# Patient Record
Sex: Male | Born: 1940 | Race: White | Hispanic: No | Marital: Married | Smoking: Never smoker
Health system: Southern US, Community
[De-identification: ages and names within clinical notes are randomized; demographics above are authoritative.]

## PROBLEM LIST (undated history)

## (undated) DIAGNOSIS — C801 Malignant (primary) neoplasm, unspecified: Secondary | ICD-10-CM

## (undated) DIAGNOSIS — E039 Hypothyroidism, unspecified: Secondary | ICD-10-CM

## (undated) HISTORY — PX: OTHER SURGICAL HISTORY: SHX169

---

## 2016-01-30 ENCOUNTER — Encounter (HOSPITAL_COMMUNITY): Payer: Self-pay | Admitting: *Deleted

## 2016-01-30 ENCOUNTER — Emergency Department (HOSPITAL_COMMUNITY)
Admission: EM | Admit: 2016-01-30 | Discharge: 2016-01-30 | Disposition: A | Discharge status: Home / Self Care | Payer: Medicare Other | Attending: Emergency Medicine | Admitting: Emergency Medicine

## 2016-01-30 DIAGNOSIS — N390 Urinary tract infection, site not specified: Secondary | ICD-10-CM

## 2016-01-30 DIAGNOSIS — T839XXA Unspecified complication of genitourinary prosthetic device, implant and graft, initial encounter: Secondary | ICD-10-CM

## 2016-01-30 DIAGNOSIS — Y733 Surgical instruments, materials and gastroenterology and urology devices (including sutures) associated with adverse incidents: Secondary | ICD-10-CM | POA: Insufficient documentation

## 2016-01-30 DIAGNOSIS — T83098A Other mechanical complication of other indwelling urethral catheter, initial encounter: Secondary | ICD-10-CM | POA: Insufficient documentation

## 2016-01-30 DIAGNOSIS — Z85118 Personal history of other malignant neoplasm of bronchus and lung: Secondary | ICD-10-CM | POA: Diagnosis not present

## 2016-01-30 HISTORY — DX: Malignant (primary) neoplasm, unspecified: C80.1

## 2016-01-30 LAB — URINE MICROSCOPIC-ADD ON

## 2016-01-30 LAB — URINALYSIS, ROUTINE W REFLEX MICROSCOPIC
BILIRUBIN URINE: NEGATIVE
GLUCOSE, UA: NEGATIVE mg/dL
Ketones, ur: NEGATIVE mg/dL
Nitrite: NEGATIVE
PH: 7.5 (ref 5.0–8.0)
Protein, ur: 30 mg/dL — AB
SPECIFIC GRAVITY, URINE: 1.009 (ref 1.005–1.030)

## 2016-01-30 MED ORDER — CEPHALEXIN 500 MG PO CAPS: 500.0000 mg | ORAL_CAPSULE | Freq: Four times a day (QID) | ORAL | Status: DC

## 2016-01-30 MED ORDER — CEPHALEXIN 500 MG PO CAPS
500.0000 mg | ORAL_CAPSULE | Freq: Once | ORAL | Status: AC
  Administered 2016-01-30: 500 mg via ORAL
  Filled 2016-01-30: qty 1

## 2016-01-30 NOTE — Discharge Instructions (Signed)
It was our pleasure to provide your ER care today - we hope that you feel better.  Empty foley bag/leg bag as need.  Your lab tests show a urine infection - take antibiotic as prescribed - we also sent a urine culture, the results of which will be back in 2-3 days.   Follow up with your doctor upon returning home.  Return to ER if worse, symptoms recur, catheter not working, not making urine, abdominal pain, fevers, other concern.     Foley Catheter Care, Adult A Foley catheter is a soft, flexible tube that is placed into the bladder to drain urine. A Foley catheter may be inserted if:  You leak urine or are not able to control when you urinate (urinary incontinence).  You are not able to urinate when you need to (urinary retention).  You had prostate surgery or surgery on the genitals.  You have certain medical conditions, such as multiple sclerosis, dementia, or a spinal cord injury. If you are going home with a Foley catheter in place, follow the instructions below. TAKING CARE OF THE CATHETER  Wash your hands with soap and water.  Using mild soap and warm water on a clean washcloth:  Clean the area on your body closest to the catheter insertion site using a circular motion, moving away from the catheter. Never wipe toward the catheter because this could sweep bacteria up into the urethra and cause infection.  Remove all traces of soap. Pat the area dry with a clean towel. For males, reposition the foreskin.  Attach the catheter to your leg so there is no tension on the catheter. Use adhesive tape or a leg strap. If you are using adhesive tape, remove any sticky residue left behind by the previous tape you used.  Keep the drainage bag below the level of the bladder, but keep it off the floor.  Check throughout the day to be sure the catheter is working and urine is draining freely. Make sure the tubing does not become kinked.  Do not pull on the catheter or try to remove  it. Pulling could damage internal tissues. TAKING CARE OF THE DRAINAGE BAGS You will be given two drainage bags to take home. One is a large overnight drainage bag, and the other is a smaller leg bag that fits underneath clothing. You may wear the overnight bag at any time, but you should never wear the smaller leg bag at night. Follow the instructions below for how to empty, change, and clean your drainage bags. Emptying the Drainage Bag You must empty your drainage bag when it is  - full or at least 2-3 times a day.  Wash your hands with soap and water.  Keep the drainage bag below your hips, below the level of your bladder. This stops urine from going back into the tubing and into your bladder.  Hold the dirty bag over the toilet or a clean container.  Open the pour spout at the bottom of the bag and empty the urine into the toilet or container. Do not let the pour spout touch the toilet, container, or any other surface. Doing so can place bacteria on the bag, which can cause an infection.  Clean the pour spout with a gauze pad or cotton ball that has rubbing alcohol on it.  Close the pour spout.  Attach the bag to your leg with adhesive tape or a leg strap.  Wash your hands well. Changing the Drainage Bag Change your  drainage bag once a month or sooner if it starts to smell bad or look dirty. Below are steps to follow when changing the drainage bag.  Wash your hands with soap and water.  Pinch off the rubber catheter so that urine does not spill out.  Disconnect the catheter tube from the drainage tube at the connection valve. Do not let the tubes touch any surface.  Clean the end of the catheter tube with an alcohol wipe. Use a different alcohol wipe to clean the end of the drainage tube.  Connect the catheter tube to the drainage tube of the clean drainage bag.  Attach the new bag to the leg with adhesive tape or a leg strap. Avoid attaching the new bag too tightly.  Wash  your hands well. Cleaning the Drainage Bag 1. Wash your hands with soap and water. 2. Wash the bag in warm, soapy water. 3. Rinse the bag thoroughly with warm water. 4. Fill the bag with a solution of white vinegar and water (1 cup vinegar to 1 qt warm water [.2 L vinegar to 1 L warm water]). Close the bag and soak it for 30 minutes in the solution. 5. Rinse the bag with warm water. 6. Hang the bag to dry with the pour spout open and hanging downward. 7. Store the clean bag (once it is dry) in a clean plastic bag. 8. Wash your hands well. PREVENTING INFECTION  Wash your hands before and after handling your catheter.  Take showers daily and wash the area where the catheter enters your body. Do not take baths. Replace wet leg straps with dry ones, if this applies.  Do not use powders, sprays, or lotions on the genital area. Only use creams, lotions, or ointments as directed by your caregiver.  For females, wipe from front to back after each bowel movement.  Drink enough fluids to keep your urine clear or pale yellow unless you have a fluid restriction.  Do not let the drainage bag or tubing touch or lie on the floor.  Wear cotton underwear to absorb moisture and to keep your skin drier. SEEK MEDICAL CARE IF:   Your urine is cloudy or smells unusually bad.  Your catheter becomes clogged.  You are not draining urine into the bag or your bladder feels full.  Your catheter starts to leak. SEEK IMMEDIATE MEDICAL CARE IF:   You have pain, swelling, redness, or pus where the catheter enters the body.  You have pain in the abdomen, legs, lower back, or bladder.  You have a fever.  You see blood fill the catheter, or your urine is pink or red.  You have nausea, vomiting, or chills.  Your catheter gets pulled out. MAKE SURE YOU:   Understand these instructions.  Will watch your condition.  Will get help right away if you are not doing well or get worse.   This information  is not intended to replace advice given to you by your health care provider. Make sure you discuss any questions you have with your health care provider.   Document Released: 06/30/2005 Document Revised: 11/14/2013 Document Reviewed: 06/21/2012 Elsevier Interactive Patient Education 2016 Elsevier Inc.    Urinary Tract Infection Urinary tract infections (UTIs) can develop anywhere along your urinary tract. Your urinary tract is your body's drainage system for removing wastes and extra water. Your urinary tract includes two kidneys, two ureters, a bladder, and a urethra. Your kidneys are a pair of bean-shaped organs. Each kidney is  about the size of your fist. They are located below your ribs, one on each side of your spine. CAUSES Infections are caused by microbes, which are microscopic organisms, including fungi, viruses, and bacteria. These organisms are so small that they can only be seen through a microscope. Bacteria are the microbes that most commonly cause UTIs. SYMPTOMS  Symptoms of UTIs may vary by age and gender of the patient and by the location of the infection. Symptoms in young women typically include a frequent and intense urge to urinate and a painful, burning feeling in the bladder or urethra during urination. Older women and men are more likely to be tired, shaky, and weak and have muscle aches and abdominal pain. A fever may mean the infection is in your kidneys. Other symptoms of a kidney infection include pain in your back or sides below the ribs, nausea, and vomiting. DIAGNOSIS To diagnose a UTI, your caregiver will ask you about your symptoms. Your caregiver will also ask you to provide a urine sample. The urine sample will be tested for bacteria and white blood cells. White blood cells are made by your body to help fight infection. TREATMENT  Typically, UTIs can be treated with medication. Because most UTIs are caused by a bacterial infection, they usually can be treated with  the use of antibiotics. The choice of antibiotic and length of treatment depend on your symptoms and the type of bacteria causing your infection. HOME CARE INSTRUCTIONS  If you were prescribed antibiotics, take them exactly as your caregiver instructs you. Finish the medication even if you feel better after you have only taken some of the medication.  Drink enough water and fluids to keep your urine clear or pale yellow.  Avoid caffeine, tea, and carbonated beverages. They tend to irritate your bladder.  Empty your bladder often. Avoid holding urine for long periods of time.  Empty your bladder before and after sexual intercourse.  After a bowel movement, women should cleanse from front to back. Use each tissue only once. SEEK MEDICAL CARE IF:   You have back pain.  You develop a fever.  Your symptoms do not begin to resolve within 3 days. SEEK IMMEDIATE MEDICAL CARE IF:   You have severe back pain or lower abdominal pain.  You develop chills.  You have nausea or vomiting.  You have continued burning or discomfort with urination. MAKE SURE YOU:   Understand these instructions.  Will watch your condition.  Will get help right away if you are not doing well or get worse.   This information is not intended to replace advice given to you by your health care provider. Make sure you discuss any questions you have with your health care provider.   Document Released: 04/09/2005 Document Revised: 03/21/2015 Document Reviewed: 08/08/2011 Elsevier Interactive Patient Education [employer].

## 2016-01-30 NOTE — ED Provider Notes (Addendum)
CSN: [id number]     Arrival date & time 01/30/16  1417 History   First MD Initiated Contact with Patient 01/30/16 1504     Chief Complaint  Patient presents with  . leaking around foley catheter      (Consider location/radiation/quality/duration/timing/severity/associated sxs/prior Treatment) The history is provided by the patient and the spouse.  Patient with hx lung ca, prostate hypertrophy, chronic foley, states today starting leaking urine around catheter.  Denies abdominal pain. No nausea/vomiting. No fever or chills. No dysuria. States has been making normal amount urine.  States other than catheter not working feels at his baseline, no new symptoms.      Past Medical History  Diagnosis Date  . Cancer ([place])     Lung Cancer with METS   Past Surgical History  Procedure Laterality Date  . Cardiac stents     No family history on file. Social History  Substance Use Topics  . Smoking status: Never Smoker   . Smokeless tobacco: None  . Alcohol Use: No    Review of Systems  Constitutional: Negative for fever.  Respiratory: Negative for shortness of breath.   Gastrointestinal: Negative for abdominal pain.  Genitourinary: Negative for dysuria.      Allergies  Review of patient's allergies indicates no known allergies.  Home Medications   Prior to Admission medications   Not on File   BP 111/66 mmHg  Pulse 79  Temp(Src) 97.6 F (36.4 C) (Oral)  Resp 18  Ht '5\' 10"'$  (1.778 m)  Wt 83.915 kg  BMI 26.54 kg/m2  SpO2 98% Physical Exam  Constitutional: He appears well-developed and well-nourished. No distress.  Eyes: Conjunctivae are normal. No scleral icterus.  Neck: Neck supple. No tracheal deviation present.  Cardiovascular: Normal rate.   Pulmonary/Chest: Effort normal. No accessory muscle usage. No respiratory distress.  Abdominal: Soft. He exhibits no distension. There is no tenderness.  Genitourinary:  Normal ext genitalia. Foley cath in place.    Musculoskeletal: Normal range of motion.  Neurological: He is alert.  Skin: Skin is warm and dry. He is not diaphoretic.  Psychiatric: He has a normal mood and affect.  Nursing note and vitals reviewed.   ED Course  Procedures (including critical care time)    Results for orders placed or performed during the hospital encounter of 01/30/16  Urinalysis, Routine w reflex microscopic (not at [facility])  Result Value Ref Range   Color, Urine YELLOW YELLOW   APPearance CLOUDY (A) CLEAR   Specific Gravity, Urine 1.009 1.005 - 1.030   pH 7.5 5.0 - 8.0   Glucose, UA NEGATIVE NEGATIVE mg/dL   Hgb urine dipstick LARGE (A) NEGATIVE   Bilirubin Urine NEGATIVE NEGATIVE   Ketones, ur NEGATIVE NEGATIVE mg/dL   Protein, ur 30 (A) NEGATIVE mg/dL   Nitrite NEGATIVE NEGATIVE   Leukocytes, UA LARGE (A) NEGATIVE  Urine microscopic-add on  Result Value Ref Range   Squamous Epithelial / LPF 0-5 (A) NONE SEEN   WBC, UA TOO NUMEROUS TO COUNT 0 - 5 WBC/hpf   RBC / HPF 6-30 0 - 5 RBC/hpf   Bacteria, UA FEW (A) NONE SEEN   Urine-Other AMORPHOUS URATES/PHOSPHATES      MDM   Leaking around foley, and current cath in place for 4+ weeks.  Will remove current foley and place new.  New catheter placed, urine draining into bag.  500 cc yellow urine in bag, cloudy.   ua with tntc wbc, will culture and rx.   Recheck pt,  states feels well. No fever. No nv.   Pt currently appears stable for d/c.     Lajean Saver, MD 01/30/16 1800

## 2016-01-30 NOTE — Progress Notes (Addendum)
Pt originally from [place] to [place] had pcp in [place] until pcp closed practice and now returns to see pcp in [place] is [facility] [place] [address], [place] [address] [phone]  Oncologist Dr Otelia Limes ? in [place] CV is Dr [name] at [place] [address], [place] [address] [phone]

## 2016-01-30 NOTE — ED Notes (Signed)
Pt's wife reports pt has been having leakage around his foley catheter x 2 days.  Pt denies any pain or blood in urine at this time.  Pt has had the foley x 4 weeks.

## 2016-01-31 LAB — URINE CULTURE

## 2016-02-04 ENCOUNTER — Emergency Department (HOSPITAL_COMMUNITY): Payer: Medicare Other

## 2016-02-04 ENCOUNTER — Observation Stay (HOSPITAL_COMMUNITY): Payer: Medicare Other

## 2016-02-04 ENCOUNTER — Inpatient Hospital Stay (HOSPITAL_COMMUNITY)
Admission: EM | Admit: 2016-02-04 | Discharge: 2016-02-07 | DRG: 871 | Disposition: A | Discharge status: Home / Self Care | Payer: Medicare Other | Attending: Internal Medicine | Admitting: Internal Medicine

## 2016-02-04 ENCOUNTER — Encounter (HOSPITAL_COMMUNITY): Payer: Self-pay | Admitting: Emergency Medicine

## 2016-02-04 DIAGNOSIS — Z9221 Personal history of antineoplastic chemotherapy: Secondary | ICD-10-CM

## 2016-02-04 DIAGNOSIS — C3492 Malignant neoplasm of unspecified part of left bronchus or lung: Secondary | ICD-10-CM | POA: Diagnosis present

## 2016-02-04 DIAGNOSIS — I951 Orthostatic hypotension: Secondary | ICD-10-CM | POA: Diagnosis present

## 2016-02-04 DIAGNOSIS — Z66 Do not resuscitate: Secondary | ICD-10-CM | POA: Diagnosis present

## 2016-02-04 DIAGNOSIS — E43 Unspecified severe protein-calorie malnutrition: Secondary | ICD-10-CM | POA: Diagnosis present

## 2016-02-04 DIAGNOSIS — R079 Chest pain, unspecified: Secondary | ICD-10-CM | POA: Diagnosis present

## 2016-02-04 DIAGNOSIS — R6510 Systemic inflammatory response syndrome (SIRS) of non-infectious origin without acute organ dysfunction: Secondary | ICD-10-CM | POA: Diagnosis not present

## 2016-02-04 DIAGNOSIS — Z79899 Other long term (current) drug therapy: Secondary | ICD-10-CM

## 2016-02-04 DIAGNOSIS — E860 Dehydration: Secondary | ICD-10-CM | POA: Diagnosis present

## 2016-02-04 DIAGNOSIS — R4182 Altered mental status, unspecified: Secondary | ICD-10-CM

## 2016-02-04 DIAGNOSIS — E2740 Unspecified adrenocortical insufficiency: Secondary | ICD-10-CM | POA: Diagnosis present

## 2016-02-04 DIAGNOSIS — I2510 Atherosclerotic heart disease of native coronary artery without angina pectoris: Secondary | ICD-10-CM | POA: Diagnosis present

## 2016-02-04 DIAGNOSIS — C3490 Malignant neoplasm of unspecified part of unspecified bronchus or lung: Secondary | ICD-10-CM | POA: Diagnosis present

## 2016-02-04 DIAGNOSIS — W19XXXA Unspecified fall, initial encounter: Secondary | ICD-10-CM | POA: Diagnosis present

## 2016-02-04 DIAGNOSIS — Z87440 Personal history of urinary (tract) infections: Secondary | ICD-10-CM

## 2016-02-04 DIAGNOSIS — Z923 Personal history of irradiation: Secondary | ICD-10-CM

## 2016-02-04 DIAGNOSIS — R410 Disorientation, unspecified: Secondary | ICD-10-CM

## 2016-02-04 DIAGNOSIS — Z7952 Long term (current) use of systemic steroids: Secondary | ICD-10-CM

## 2016-02-04 DIAGNOSIS — Z955 Presence of coronary angioplasty implant and graft: Secondary | ICD-10-CM

## 2016-02-04 DIAGNOSIS — A419 Sepsis, unspecified organism: Principal | ICD-10-CM | POA: Diagnosis present

## 2016-02-04 DIAGNOSIS — Z7982 Long term (current) use of aspirin: Secondary | ICD-10-CM

## 2016-02-04 DIAGNOSIS — Z85118 Personal history of other malignant neoplasm of bronchus and lung: Secondary | ICD-10-CM

## 2016-02-04 DIAGNOSIS — Z881 Allergy status to other antibiotic agents status: Secondary | ICD-10-CM

## 2016-02-04 DIAGNOSIS — R531 Weakness: Secondary | ICD-10-CM

## 2016-02-04 DIAGNOSIS — J189 Pneumonia, unspecified organism: Secondary | ICD-10-CM | POA: Diagnosis present

## 2016-02-04 HISTORY — DX: Hypothyroidism, unspecified: E03.9

## 2016-02-04 LAB — TROPONIN I: Troponin I: <0.03 ng/mL (ref <0.03)

## 2016-02-04 LAB — CBC WITH DIFFERENTIAL/PLATELET
Basophils Absolute: 0 10*3/uL (ref 0.0–0.1)
Basophils Relative: 0 %
EOS ABS: 0.2 10*3/uL (ref 0.0–0.7)
Eosinophils Relative: 3 %
HEMATOCRIT: 34.3 % — AB (ref 39.0–52.0)
HEMOGLOBIN: 11.7 g/dL — AB (ref 13.0–17.0)
LYMPHS ABS: 1.3 10*3/uL (ref 0.7–4.0)
LYMPHS PCT: 21 %
MCH: 29.7 pg (ref 26.0–34.0)
MCHC: 34.1 g/dL (ref 30.0–36.0)
MCV: 87.1 fL (ref 78.0–100.0)
MONOS PCT: 8 %
Monocytes Absolute: 0.5 10*3/uL (ref 0.1–1.0)
NEUTROS PCT: 68 %
Neutro Abs: 4.3 10*3/uL (ref 1.7–7.7)
Platelets: 206 10*3/uL (ref 150–400)
RBC: 3.94 MIL/uL — ABNORMAL LOW (ref 4.22–5.81)
RDW: 12.9 % (ref 11.5–15.5)
WBC: 6.3 10*3/uL (ref 4.0–10.5)

## 2016-02-04 LAB — BASIC METABOLIC PANEL
Anion gap: 9 (ref 5–15)
BUN: 16 mg/dL (ref 6–20)
CO2: 21 mmol/L — AB (ref 22–32)
Calcium: 8.7 mg/dL — ABNORMAL LOW (ref 8.9–10.3)
Chloride: 105 mmol/L (ref 101–111)
Creatinine, Ser: 0.69 mg/dL (ref 0.61–1.24)
GFR calc Af Amer: >60 mL/min (ref >60)
GLUCOSE: 84 mg/dL (ref 65–99)
POTASSIUM: 3.1 mmol/L — AB (ref 3.5–5.1)
Sodium: 135 mmol/L (ref 135–145)

## 2016-02-04 LAB — URINALYSIS, ROUTINE W REFLEX MICROSCOPIC
BILIRUBIN URINE: NEGATIVE
GLUCOSE, UA: NEGATIVE mg/dL
Ketones, ur: NEGATIVE mg/dL
Nitrite: NEGATIVE
PH: 7 (ref 5.0–8.0)
Protein, ur: NEGATIVE mg/dL
SPECIFIC GRAVITY, URINE: 1.018 (ref 1.005–1.030)

## 2016-02-04 LAB — URINE MICROSCOPIC-ADD ON

## 2016-02-04 LAB — D-DIMER, QUANTITATIVE (NOT AT ARMC): D DIMER QUANT: 0.75 ug{FEU}/mL — AB (ref 0.00–0.50)

## 2016-02-04 LAB — PROTIME-INR
INR: 1.14 (ref 0.00–1.49)
Prothrombin Time: 14.8 seconds (ref 11.6–15.2)

## 2016-02-04 LAB — APTT: APTT: 43 s — AB (ref 24–37)

## 2016-02-04 LAB — I-STAT CG4 LACTIC ACID, ED
Lactic Acid, Venous: 1.95 mmol/L (ref 0.5–1.9)
Lactic Acid, Venous: 1.14 mmol/L (ref 0.5–1.9)

## 2016-02-04 LAB — TSH: TSH: 2.366 u[IU]/mL (ref 0.350–4.500)

## 2016-02-04 LAB — PROCALCITONIN: Procalcitonin: <0.1 ng/mL

## 2016-02-04 LAB — I-STAT TROPONIN, ED: Troponin i, poc: 0.01 ng/mL (ref 0.00–0.08)

## 2016-02-04 MED ORDER — ONDANSETRON HCL 4 MG PO TABS: 4.0000 mg | ORAL_TABLET | Freq: Four times a day (QID) | ORAL | Status: DC | PRN

## 2016-02-04 MED ORDER — SODIUM CHLORIDE 0.9 % IV SOLN
INTRAVENOUS | Status: DC
  Administered 2016-02-04: 17:00:00 via INTRAVENOUS

## 2016-02-04 MED ORDER — ACETAMINOPHEN 325 MG PO TABS
650.0000 mg | ORAL_TABLET | Freq: Once | ORAL | Status: AC
  Administered 2016-02-04: 650 mg via ORAL
  Filled 2016-02-04: qty 2

## 2016-02-04 MED ORDER — ENSURE ENLIVE PO LIQD
237.0000 mL | Freq: Two times a day (BID) | ORAL | Status: DC
  Administered 2016-02-05 – 2016-02-07 (×4): 237 mL via ORAL

## 2016-02-04 MED ORDER — ONDANSETRON HCL 4 MG/2ML IJ SOLN: 4.0000 mg | Freq: Four times a day (QID) | INTRAMUSCULAR | Status: DC | PRN

## 2016-02-04 MED ORDER — ACETAMINOPHEN 650 MG RE SUPP: 650.0000 mg | Freq: Four times a day (QID) | RECTAL | Status: DC | PRN

## 2016-02-04 MED ORDER — SODIUM CHLORIDE 0.9 % IV BOLUS (SEPSIS)
1000.0000 mL | Freq: Once | INTRAVENOUS | Status: AC
  Administered 2016-02-04: 1000 mL via INTRAVENOUS

## 2016-02-04 MED ORDER — HYDROCORTISONE NA SUCCINATE PF 100 MG IJ SOLR
50.0000 mg | Freq: Three times a day (TID) | INTRAMUSCULAR | Status: DC
  Administered 2016-02-04 – 2016-02-06 (×6): 50 mg via INTRAVENOUS
  Filled 2016-02-04 (×6): qty 2

## 2016-02-04 MED ORDER — CEFEPIME HCL 2 G IJ SOLR
2.0000 g | Freq: Once | INTRAMUSCULAR | Status: AC
  Administered 2016-02-04: 2 g via INTRAVENOUS
  Filled 2016-02-04: qty 2

## 2016-02-04 MED ORDER — ASPIRIN 81 MG PO CHEW
324.0000 mg | CHEWABLE_TABLET | Freq: Once | ORAL | Status: AC
  Administered 2016-02-04: 324 mg via ORAL
  Filled 2016-02-04: qty 4

## 2016-02-04 MED ORDER — HYDROCODONE-ACETAMINOPHEN 5-325 MG PO TABS: 1.0000 | ORAL_TABLET | ORAL | Status: DC | PRN

## 2016-02-04 MED ORDER — POTASSIUM CHLORIDE CRYS ER 20 MEQ PO TBCR
40.0000 meq | EXTENDED_RELEASE_TABLET | Freq: Once | ORAL | Status: AC
  Administered 2016-02-04: 40 meq via ORAL
  Filled 2016-02-04: qty 2

## 2016-02-04 MED ORDER — PRASUGREL HCL 10 MG PO TABS
10.0000 mg | ORAL_TABLET | Freq: Every day | ORAL | Status: DC
  Administered 2016-02-04 – 2016-02-07 (×4): 10 mg via ORAL
  Filled 2016-02-04 (×4): qty 1

## 2016-02-04 MED ORDER — PIPERACILLIN-TAZOBACTAM 3.375 G IVPB
3.3750 g | Freq: Three times a day (TID) | INTRAVENOUS | Status: DC
Start: 2016-02-04 — End: 2016-02-05
  Administered 2016-02-04 – 2016-02-05 (×2): 3.375 g via INTRAVENOUS
  Filled 2016-02-04 (×3): qty 50

## 2016-02-04 MED ORDER — VANCOMYCIN HCL IN DEXTROSE 1-5 GM/200ML-% IV SOLN
1000.0000 mg | Freq: Two times a day (BID) | INTRAVENOUS | Status: DC
  Administered 2016-02-05: 1000 mg via INTRAVENOUS
  Filled 2016-02-04: qty 200

## 2016-02-04 MED ORDER — MORPHINE SULFATE (PF) 2 MG/ML IV SOLN: 1.0000 mg | INTRAVENOUS | Status: DC | PRN

## 2016-02-04 MED ORDER — VANCOMYCIN HCL 10 G IV SOLR
1500.0000 mg | INTRAVENOUS | Status: AC
  Administered 2016-02-04: 1500 mg via INTRAVENOUS
  Filled 2016-02-04: qty 1500

## 2016-02-04 MED ORDER — HEPARIN SODIUM (PORCINE) 5000 UNIT/ML IJ SOLN
5000.0000 [IU] | Freq: Three times a day (TID) | INTRAMUSCULAR | Status: DC
  Administered 2016-02-04 – 2016-02-05 (×2): 5000 [IU] via SUBCUTANEOUS
  Filled 2016-02-04 (×3): qty 1

## 2016-02-04 MED ORDER — SODIUM CHLORIDE 0.9% FLUSH
3.0000 mL | Freq: Two times a day (BID) | INTRAVENOUS | Status: DC
  Administered 2016-02-04 – 2016-02-07 (×4): 3 mL via INTRAVENOUS

## 2016-02-04 MED ORDER — ASPIRIN EC 81 MG PO TBEC
81.0000 mg | DELAYED_RELEASE_TABLET | Freq: Every day | ORAL | Status: DC
  Administered 2016-02-04 – 2016-02-07 (×4): 81 mg via ORAL
  Filled 2016-02-04 (×4): qty 1

## 2016-02-04 MED ORDER — IOPAMIDOL (ISOVUE-370) INJECTION 76%
100.0000 mL | Freq: Once | INTRAVENOUS | Status: AC | PRN
  Administered 2016-02-04: 100 mL via INTRAVENOUS

## 2016-02-04 MED ORDER — ACETAMINOPHEN 325 MG PO TABS: 650.0000 mg | ORAL_TABLET | Freq: Four times a day (QID) | ORAL | Status: DC | PRN

## 2016-02-04 MED ORDER — SODIUM CHLORIDE 0.9 % IV BOLUS (SEPSIS)
500.0000 mL | Freq: Once | INTRAVENOUS | Status: AC
  Administered 2016-02-04: 500 mL via INTRAVENOUS

## 2016-02-04 NOTE — ED Notes (Signed)
Patient transported to X-ray 

## 2016-02-04 NOTE — H&P (Signed)
History and Physical    Joseph Cross QPY:[id number] DOB: [birth year] DOA: 02/04/2016  PCP: PROVIDER NOT IN SYSTEM  Patient coming from: Home  Chief Complaint: Chest pain and generalized weakness  HPI: Joseph Cross is a 75 y.o. male with medical history significant of stage IV left-sided lung cancer, had chemotherapy and radiation in January of this year and the whole brain radiation. He also had 2 stents about the same time he is on aspirin if he can for that. Patient came into the hospital because of chest pain. His wife at bedside and she contributed to the history taking. Patient is from [place], [place] and he is here visiting his daughter. For the past 2-3 days he has been they're weak, wobbly when he gets up, he had a fall this morning without hitting his head. He mentioned that he has some chest pain yesterday which he thought it was indigestion and he took Tums for that. The pain came back this morning and with the fall he came into the ED for further evaluation.  ED Course:  Vitals: Temperature of 100.8 and respiratory rate of 24. Labs: Generally normal, mild hypokalemia with 3.1, lactic acid is 1.95. Imaging: Chest x-ray showed bilateral perihilar interstitial opacity may represent mild component of pulmonary edema or chronic pulmonary changes. Interventions: Given cefepime, 2.5 L of normal saline Tylenol and potassium in the ED.  Review of Systems:  Constitutional: Generalized weakness Eyes: negative for irritation, redness and visual disturbance Ears, nose, mouth, throat, and face: negative for earaches, epistaxis, nasal congestion and sore throat Respiratory: negative for cough, dyspnea on exertion, sputum and wheezing Cardiovascular: negative for chest pain, dyspnea, lower extremity edema, orthopnea, palpitations and syncope Gastrointestinal: negative for abdominal pain, constipation, diarrhea, melena, nausea and vomiting Genitourinary:negative for dysuria, frequency and  hematuria Hematologic/lymphatic: negative for bleeding, easy bruising and lymphadenopathy Musculoskeletal:negative for arthralgias, muscle weakness and stiff joints Neurological: negative for coordination problems, gait problems, headaches and weakness Endocrine: negative for diabetic symptoms including polydipsia, polyuria and weight loss Allergic/Immunologic: negative for anaphylaxis, hay fever and urticaria  Past Medical History:  Diagnosis Date  . Cancer ([place])    Lung Cancer with METS    Past Surgical History:  Procedure Laterality Date  . cardiac stents       reports that he has never smoked. He has never used smokeless tobacco. He reports that he does not drink alcohol or use drugs.  Allergies  Allergen Reactions  . Flagyl [Metronidazole] Hives and Rash    Family history No family history of lung cancer  Prior to Admission medications   Medication Sig Start Date End Date Taking? Authorizing Provider  aspirin EC 81 MG tablet Take 81 mg by mouth daily.   Yes Historical Provider, MD  Calcium Carbonate Antacid (MAALOX PO) Take 2 tablets by mouth daily as needed (indigestion).   Yes Historical Provider, MD  cephALEXin (KEFLEX) 500 MG capsule Take 1 capsule (500 mg total) by mouth 4 (four) times daily. 01/30/16  Yes Lajean Saver, MD  citalopram (CELEXA) 20 MG tablet Take 20 mg by mouth daily.   Yes Historical Provider, MD  finasteride (PROSCAR) 5 MG tablet Take 5 mg by mouth daily.   Yes Historical Provider, MD  fludrocortisone (FLORINEF) 0.1 MG tablet Take 0.1 mg by mouth daily.   Yes Historical Provider, MD  levothyroxine (SYNTHROID, LEVOTHROID) 25 MCG tablet Take 25 mcg by mouth daily before breakfast.   Yes Historical Provider, MD  midodrine (PROAMATINE) 2.5 MG tablet Take 2.5 mg  by mouth daily as needed (if systolic blood pressure is under 100).    Yes Historical Provider, MD  Multiple Vitamin (MULTIVITAMIN WITH MINERALS) TABS tablet Take 1 tablet by mouth daily.   Yes  Historical Provider, MD  prasugrel (EFFIENT) 10 MG TABS tablet Take 10 mg by mouth daily.   Yes Historical Provider, MD  predniSONE (DELTASONE) 20 MG tablet Take 10 mg by mouth daily with breakfast.    Yes Historical Provider, MD  ranitidine (ZANTAC) 150 MG tablet Take 150 mg by mouth daily.   Yes Historical Provider, MD  tamsulosin (FLOMAX) 0.4 MG CAPS capsule Take 0.4 mg by mouth daily.   Yes Historical Provider, MD    Physical Exam:  Vitals:   02/04/16 1229 02/04/16 1308 02/04/16 1330 02/04/16 1430  BP:  109/63 110/66 (!) 104/52  Pulse:  78 76 81  Resp:  '23 20 23  '$ Temp: 100.8 F (38.2 C)   98 F (36.7 C)  TempSrc: Rectal   Oral  SpO2:  93% 92% 95%  Weight:  79.4 kg (175 lb)    Height:  '5\' 10"'$  (1.778 m)      Constitutional: NAD, calm, comfortable Eyes: PERRL, lids and conjunctivae normal ENMT: Mucous membranes are moist. Posterior pharynx clear of any exudate or lesions.Normal dentition.  Neck: normal, supple, no masses, no thyromegaly Respiratory: clear to auscultation bilaterally, no wheezing, no crackles. Normal respiratory effort. No accessory muscle use.  Cardiovascular: Regular rate and rhythm, no murmurs / rubs / gallops. No extremity edema. 2+ pedal pulses. No carotid bruits.  Abdomen: no tenderness, no masses palpated. No hepatosplenomegaly. Bowel sounds positive.  Musculoskeletal: no clubbing / cyanosis. No joint deformity upper and lower extremities. Good ROM, no contractures. Normal muscle tone.  Skin: no rashes, lesions, ulcers. No induration Neurologic: CN 2-12 grossly intact. Sensation intact, DTR normal. Strength 5/5 in all 4.  Psychiatric: Normal judgment and insight. Alert and oriented x 3. Normal mood.   Labs on Admission: I have personally reviewed following labs and imaging studies  CBC:  Recent Labs Lab 02/04/16 1229  WBC 6.3  NEUTROABS 4.3  HGB 11.7*  HCT 34.3*  MCV 87.1  PLT 382   Basic Metabolic Panel:  Recent Labs Lab 02/04/16 1229  NA  135  K 3.1*  CL 105  CO2 21*  GLUCOSE 84  BUN 16  CREATININE 0.69  CALCIUM 8.7*   GFR: Estimated Creatinine Clearance: 84.9 mL/min (by C-G formula based on SCr of 0.8 mg/dL). Liver Function Tests: No results for input(s): AST, ALT, ALKPHOS, BILITOT, PROT, ALBUMIN in the last 168 hours. No results for input(s): LIPASE, AMYLASE in the last 168 hours. No results for input(s): AMMONIA in the last 168 hours. Coagulation Profile: No results for input(s): INR, PROTIME in the last 168 hours. Cardiac Enzymes: No results for input(s): CKTOTAL, CKMB, CKMBINDEX, TROPONINI in the last 168 hours. BNP (last 3 results) No results for input(s): PROBNP in the last 8760 hours. HbA1C: No results for input(s): HGBA1C in the last 72 hours. CBG: No results for input(s): GLUCAP in the last 168 hours. Lipid Profile: No results for input(s): CHOL, HDL, LDLCALC, TRIG, CHOLHDL, LDLDIRECT in the last 72 hours. Thyroid Function Tests: No results for input(s): TSH, T4TOTAL, FREET4, T3FREE, THYROIDAB in the last 72 hours. Anemia Panel: No results for input(s): VITAMINB12, FOLATE, FERRITIN, TIBC, IRON, RETICCTPCT in the last 72 hours. Urine analysis:    Component Value Date/Time   COLORURINE AMBER (A) 02/04/2016 1214   APPEARANCEUR CLOUDY (  A) 02/04/2016 1214   LABSPEC 1.018 02/04/2016 1214   PHURINE 7.0 02/04/2016 1214   GLUCOSEU NEGATIVE 02/04/2016 1214   HGBUR SMALL (A) 02/04/2016 1214   BILIRUBINUR NEGATIVE 02/04/2016 1214   KETONESUR NEGATIVE 02/04/2016 1214   PROTEINUR NEGATIVE 02/04/2016 1214   NITRITE NEGATIVE 02/04/2016 1214   LEUKOCYTESUR SMALL (A) 02/04/2016 1214   Sepsis Labs: !!!!!!!!!!!!!!!!!!!!!!!!!!!!!!!!!!!!!!!!!!!! Invalid input(s): PROCALCITONIN, LACTICIDVEN Recent Results (from the past 240 hour(s))  Urine culture     Status: Abnormal   Collection Time: 01/30/16  5:00 PM  Result Value Ref Range Status   Specimen Description URINE, CATHETERIZED  Final   Special Requests NONE   Final   Culture MULTIPLE SPECIES PRESENT, SUGGEST RECOLLECTION (A)  Final   Report Status 01/31/2016 FINAL  Final     Radiological Exams on Admission: Dg Chest 2 View  Result Date: 02/04/2016 CLINICAL DATA:  Patient with generalized weakness for 2 days. Anterior chest pain and discomfort. EXAM: CHEST  2 VIEW COMPARISON:  None. FINDINGS: Monitoring leads overlie the patient. Patient is rotated to the left. Cardiomegaly. Pulmonary vascular redistribution. Bilateral perihilar interstitial pulmonary opacities. No definite pleural effusion. Thoracic spine degenerative changes. IMPRESSION: Cardiomegaly. Bilateral perihilar interstitial opacities may represent mild component of pulmonary edema or chronic pulmonary changes. Atypical infection not excluded. Electronically Signed   By: Lovey Newcomer M.D.   On: 02/04/2016 13:13   EKG: Independently reviewed.   Assessment/Plan Principal Problem:   Chest pain Active Problems:   SIRS (systemic inflammatory response syndrome) (HCC)   SCLC (small cell lung carcinoma) (HCC)   Orthostatic hypotension   Hypoadrenalism (HCC)   CAD in native artery    Chest pain -Presented with substernal chest pain, atypical, comes and goes, relieved yesterday by Tums. -Cycle 3 sets of cardiac enzymes and repeat EKG in a.m. -Has positive d-dimer, because of active cancer we will check CT angiography of the chest to rule out PE. -Cannot rule out atypical infection per chest x-ray will check CTA.  SIRS -He might have early sepsis with temperature of 100.8 and respiratory rate of 22 and suspected pulmonary infection. -Has normal lactic acid but he is confused. -Started on broad-spectrum antibiotics after given 2.5 L of fluids, will continue. Follow cultures.  Fall [place] earlier today, he has multiple reasons to fall, sepsis, orthostatic hypotension, hypoadrenalism and dehydration. -Hydrated with IV fluids. Patient started on stress dose of steroids and  antibiotics. -Check MRI of the brain.  SCLC -Has stage IV SCLC, underwent chemotherapy and radiation back in January 2017. -Currently not on any chemotherapy and he is DNR/DNI. -Check CT angio to rule out PEs and check MRI of the brain to rule out any metastases.  Adrenal insufficiency -Patient is on Florinef and prednisone chronically, wife reported low blood pressure every now and then. -Started on stress dose of steroids.  CAD in native artery -Had stents in January 2017, he is on aspirin and Effient, continue.   DVT prophylaxis: SQ Heparin Code Status: DNR/DNI Family Communication: Plan D/W patient results of his wife at bedside Disposition Plan: Home Consults called:  Admission status: Telemetry, inpatient   [facility] A MD [facility] Pager [phone]  If 7PM-7AM, please contact night-coverage [email or web address] Password [email or web address]  02/04/2016, 3:24 PM

## 2016-02-04 NOTE — Progress Notes (Signed)
pcp is [name] [address], [name] [address] [phone]  Oncologist Dr Otelia Limes ? in [place] CV is Dr [name] at [place] [address], [place] [address] [phone]

## 2016-02-04 NOTE — ED Triage Notes (Signed)
Patient has stage 4 lung cancer who was recently treated for uti.  Per family ever since he was put on antibiotics he has been weak, fatigue, lying around and not eating. Today patient c/o chest discomfort.

## 2016-02-04 NOTE — ED Notes (Signed)
Admitting MD at bedside.

## 2016-02-04 NOTE — ED Provider Notes (Signed)
Patient with generalized weakness for the past 2 days accompanied by anterior chest discomfort felt like pressure this morning lasting 3 hours which has since resolved. Presently asymptomatic. Patient with metastatic lung cancer presently on exam no distress lungs clear auscultation heart regular rate and rhythm abdomen nondistended nontender extremities without edema.   Orlie Dakin, MD 02/04/16 1242

## 2016-02-04 NOTE — Progress Notes (Signed)
Pharmacy Antibiotic Note  Joseph Cross is a 75 y.o. 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 Pharmacy has been consulted for Vancomycin and Zosyn dosing for sepsis.  Allergy noted to flagyl.  Pt received cefepime x 1 in ED.   CrCl ~76 CG / 66 N with rounded SCr ~1   Plan: Vancomycin '1500mg'$  x 1 loading dose then start Vancomycin 1g IV q12h (goal trough 15-2mg/ml) Zosyn 3.375g IV q8h (infuse over 4 hours) F/u VT at Css  F/u renal function, cultures, clinical course  Height: '5\' 9"'$  (175.3 cm) Weight: 182 lb 5.1 oz (82.7 kg) IBW/kg (Calculated) : 70.7  Temp (24hrs), Avg:98.6 F (37 C), Min:97.4 F (36.3 C), Max:100.8 F (38.2 C)   Recent Labs Lab 02/04/16 1154 02/04/16 1229 02/04/16 1418  WBC  --  6.3  --   CREATININE  --  0.69  --   LATICACIDVEN 1.95*  --  1.14    Estimated Creatinine Clearance: 82.2 mL/min (by C-G formula based on SCr of 0.8 mg/dL).    Allergies  Allergen Reactions  . Flagyl [Metronidazole] Hives and Rash    Antimicrobials this admission: 7/24 Cefepime x1 7/24 Vancomycin >> 7/24 Zosyn >>  Dose adjustments this admission:  Microbiology results: 7/24 BCx: collected 7/24 UCx: collected   Thank you for allowing pharmacy to be a part of this patient's care.  CRalene Bathe PharmD, BCPS 02/04/2016, 4:47 PM  Pager: [phone]

## 2016-02-04 NOTE — ED Provider Notes (Signed)
[facility] DEPT Provider Note   CSN: [id number] Arrival date & time: 02/04/16  1102  First Provider Contact:  First MD Initiated Contact with Patient 02/04/16 1126      History   Chief Complaint Chief Complaint  Patient presents with  . Weakness  . Chest Pain   HPI  Joseph Cross is an 75 y.o. male with history of metastatic lung cancer, CAD (s/p 4 stents placed over the past year), chronic indwelling foley catheter who presents to the ED for evaluation of generalized weakness and chest pain. He is accompanied by his wife and two daughters who help provide his history. Pt was seen in the ED on 7/19 for evaluation of foley obstruction. His cath was replaced at that time and he was treated with keflex for a UTI. Pt's family reports that since then pt has been lethargic, generally weak, with poor PO intake. He has had some intermittent confusion increased from baseline. Last night pt started complaining of "indigestion"-like chest pain. That pain apparently resolved but returned this morning. Pt reportedly complained of substernal burning chest pain this morning with associated SOB and nausea. The SOB has now resolved. Pt states the pain is "not bad." Pt's wife is also concerned Of note pt and his wife live in [place] but they receive all their medical care, including pt's oncology care, in [place] at [facility] facilities. Pt could not tolerate any chemotherapy or immunotherapy and is not currently being treated for his cancer.  Pt is DNR/DNI.  Oncologist: Dr. Durward Mallard at [facility]  Past Medical History:  Diagnosis Date  . Cancer ([place])    Lung Cancer with METS    There are no active problems to display for this patient.   Past Surgical History:  Procedure Laterality Date  . cardiac stents       Home Medications    Prior to Admission medications   Medication Sig Start Date End Date Taking? Authorizing Provider  aspirin EC 81 MG tablet Take 81 mg by mouth  daily.   Yes Historical Provider, MD  Calcium Carbonate Antacid (MAALOX PO) Take 2 tablets by mouth daily as needed (indigestion).   Yes Historical Provider, MD  cephALEXin (KEFLEX) 500 MG capsule Take 1 capsule (500 mg total) by mouth 4 (four) times daily. 01/30/16  Yes Lajean Saver, MD  citalopram (CELEXA) 20 MG tablet Take 20 mg by mouth daily.   Yes Historical Provider, MD  finasteride (PROSCAR) 5 MG tablet Take 5 mg by mouth daily.   Yes Historical Provider, MD  fludrocortisone (FLORINEF) 0.1 MG tablet Take 0.1 mg by mouth daily.   Yes Historical Provider, MD  levothyroxine (SYNTHROID, LEVOTHROID) 25 MCG tablet Take 25 mcg by mouth daily before breakfast.   Yes Historical Provider, MD  midodrine (PROAMATINE) 2.5 MG tablet Take 2.5 mg by mouth daily as needed (if systolic blood pressure is over 100).    Yes Historical Provider, MD  Multiple Vitamin (MULTIVITAMIN WITH MINERALS) TABS tablet Take 1 tablet by mouth daily.   Yes Historical Provider, MD  prasugrel (EFFIENT) 10 MG TABS tablet Take 10 mg by mouth daily.   Yes Historical Provider, MD  predniSONE (DELTASONE) 20 MG tablet Take 20 mg by mouth daily with breakfast.   Yes Historical Provider, MD  predniSONE (DELTASONE) 5 MG tablet Take 5-15 mg by mouth daily with breakfast. Take 15 mg daily for 7 days, then take 10 mg daily for 7 days, then take '5mg'$ s daily   Yes Historical Provider,  MD  ranitidine (ZANTAC) 150 MG tablet Take 150 mg by mouth daily.   Yes Historical Provider, MD  tamsulosin (FLOMAX) 0.4 MG CAPS capsule Take 0.4 mg by mouth daily.   Yes Historical Provider, MD    Family History No family history on file.  Social History Social History  Substance Use Topics  . Smoking status: Never Smoker  . Smokeless tobacco: Never Used  . Alcohol use No     Allergies   Flagyl [metronidazole]   Review of Systems Review of Systems 10 Systems reviewed and are negative for acute change except as noted in the HPI.   Physical  Exam Updated Vital Signs BP 99/71 (BP Location: Left Arm)   Pulse 80   Temp 97.4 F (36.3 C) (Oral)   Resp (!) 28   SpO2 93%   Physical Exam  Constitutional: He is oriented to person, place, and time. No distress.  Chronically ill appearing, pale  HENT:  Right Ear: External ear normal.  Left Ear: External ear normal.  Nose: Nose normal.  Mouth/Throat: No oropharyngeal exudate.  MM dry  Eyes: Conjunctivae and EOM are normal. Pupils are equal, round, and reactive to light.  Neck: Normal range of motion. Neck supple.  Cardiovascular: Normal rate, regular rhythm, normal heart sounds and intact distal pulses.   Pulmonary/Chest: Effort normal and breath sounds normal. He has no wheezes. He has no rales. He exhibits no tenderness.  Abdominal: Soft. Bowel sounds are normal. He exhibits no distension. There is tenderness in the suprapubic area. There is guarding. There is no rebound.  Genitourinary:  Genitourinary Comments: Foley catheter in place. Scant dark yellow urine in bag.   Musculoskeletal: He exhibits no edema.  Neurological: He is alert and oriented to person, place, and time. No cranial nerve deficit.  Moves all extremities freely No pronator drift Follows 2-step commands Some slowing in response No dysarthria Oriented to person, place. Does not know year.    Skin: Skin is warm and dry. He is not diaphoretic.  Psychiatric: He has a normal mood and affect.  Nursing note and vitals reviewed.   Vitals:   02/04/16 1229 02/04/16 1308 02/04/16 1330 02/04/16 1430  BP:  109/63 110/66 (!) 104/52  Pulse:  78 76 81  Resp:  '23 20 23  '$ Temp: 100.8 F (38.2 C)   98 F (36.7 C)  TempSrc: Rectal   Oral  SpO2:  93% 92% 95%  Weight:  79.4 kg    Height:  '5\' 10"'$  (1.778 m)      ED Treatments / Results  Labs (all labs ordered are listed, but only abnormal results are displayed) Labs Reviewed  BASIC METABOLIC PANEL - Abnormal; Notable for the following:       Result Value    Potassium 3.1 (*)    CO2 21 (*)    Calcium 8.7 (*)    All other components within normal limits  CBC WITH DIFFERENTIAL/PLATELET - Abnormal; Notable for the following:    RBC 3.94 (*)    Hemoglobin 11.7 (*)    HCT 34.3 (*)    All other components within normal limits  URINALYSIS, ROUTINE W REFLEX MICROSCOPIC (NOT AT [facility]) - Abnormal; Notable for the following:    Color, Urine AMBER (*)    APPearance CLOUDY (*)    Hgb urine dipstick SMALL (*)    Leukocytes, UA SMALL (*)    All other components within normal limits  URINE MICROSCOPIC-ADD ON - Abnormal; Notable for the following:  Squamous Epithelial / LPF 0-5 (*)    Bacteria, UA FEW (*)    All other components within normal limits  I-STAT CG4 LACTIC ACID, ED - Abnormal; Notable for the following:    Lactic Acid, Venous 1.95 (*)    All other components within normal limits  URINE CULTURE  CULTURE, BLOOD (ROUTINE X 2)  CULTURE, BLOOD (ROUTINE X 2)  I-STAT TROPOININ, ED  I-STAT CG4 LACTIC ACID, ED    EKG  EKG Interpretation  Date/Time:  Monday February 04 2016 11:05:56 EDT Ventricular Rate:  76 PR Interval:    QRS Duration: 89 QT Interval:  374 QTC Calculation: 421 R Axis:   8 Text Interpretation:  Sinus rhythm Non-specific ST-t changes Confirmed by Wilson Singer  MD, STEPHEN ([id number]) on 02/04/2016 11:34:41 AM       Radiology Dg Chest 2 View  Result Date: 02/04/2016 CLINICAL DATA:  Patient with generalized weakness for 2 days. Anterior chest pain and discomfort. EXAM: CHEST  2 VIEW COMPARISON:  None. FINDINGS: Monitoring leads overlie the patient. Patient is rotated to the left. Cardiomegaly. Pulmonary vascular redistribution. Bilateral perihilar interstitial pulmonary opacities. No definite pleural effusion. Thoracic spine degenerative changes. IMPRESSION: Cardiomegaly. Bilateral perihilar interstitial opacities may represent mild component of pulmonary edema or chronic pulmonary changes. Atypical infection not excluded. Electronically  Signed   By: Lovey Newcomer M.D.   On: 02/04/2016 13:13   Procedures Procedures (including critical care time)  Medications Ordered in ED Medications  acetaminophen (TYLENOL) tablet 650 mg (not administered)  potassium chloride SA (K-DUR,KLOR-CON) CR tablet 40 mEq (not administered)  sodium chloride 0.9 % bolus 1,000 mL (0 mLs Intravenous Stopped 02/04/16 1300)  aspirin chewable tablet 324 mg (324 mg Oral Given 02/04/16 1211)  sodium chloride 0.9 % bolus 1,000 mL (1,000 mLs Intravenous New Bag/Given 02/04/16 1309)  sodium chloride 0.9 % bolus 500 mL (500 mLs Intravenous New Bag/Given 02/04/16 1310)  ceFEPIme (MAXIPIME) 2 g in dextrose 5 % 50 mL IVPB (2 g Intravenous New Bag/Given 02/04/16 1309)     Initial Impression / Assessment and Plan / ED Course  I have reviewed the triage vital signs and the nursing notes.  Pertinent labs & imaging results that were available during my care of the patient were reviewed by me and considered in my medical decision making (see chart for details).  Clinical Course   12:07 PM Pt is an 75 y.o. male with multiple major medical issues including metastatic lung cancer (no longer under any therapy) and CAD (s/p four stent placements, now on Effient and ASA) who presents with generalized weakness, lethargy, poor PO intake, and intermittent confusion since 7/19. Currently being treated for UTI with keflex. Also has had chest pain intermittently since last night. Will check broad spectrum labs including CBC, BMP, troponin, lactic acid. Will check CXR, re-collect urine to send for UA/UC. Nursing staff did a bladder scan at bedside with <150 CC of urine in the bladder. Fluids ordered. ASA ordered.  1:04 PM Rectal temp 100.8. Lactic acid 1.95. With fever, initial tachypnea (now improved), and elevated lactic acid we will initiate empiric antibiotics (UA and CXR are pending, will start cefepime for presumed UTI) and weight-based fluids. Will give tylenol. Discussed with pt  and family I anticipate hospitalist admission and they are in agreement.  2:02 PM  UA improved from prior. 6-30 WBC, small bacteria. CXR with bilateral perihilar opacities; poss pulm edema/pulm changes vs atypical infection. Pt has received cefepime at this point. Not entirely clear source  of infection. However, with weakness and AMS we will proceed with plan for hospitalist admission.   2:40 PM I spoke with Dr. Hartford Poli who will admit pt to tele obs. We will add d-dimer.   Final Clinical Impressions(s) / ED Diagnoses   Final diagnoses:  SIRS (systemic inflammatory response syndrome) (HCC)  Generalized weakness  Chest pain, unspecified chest pain type  Altered mental status, unspecified altered mental status type    New Prescriptions New Prescriptions   No medications on file     Anne Ng, Hershal Coria 02/04/16 [place], MD 02/04/16 1918

## 2016-02-04 NOTE — ED Notes (Signed)
Spoke to [employer] -  Will let fluids run for a little while then try to get 2nd set of cultures.

## 2016-02-05 ENCOUNTER — Observation Stay (HOSPITAL_COMMUNITY): Payer: Medicare Other

## 2016-02-05 DIAGNOSIS — Z85118 Personal history of other malignant neoplasm of bronchus and lung: Secondary | ICD-10-CM | POA: Diagnosis not present

## 2016-02-05 DIAGNOSIS — Z9221 Personal history of antineoplastic chemotherapy: Secondary | ICD-10-CM | POA: Diagnosis not present

## 2016-02-05 DIAGNOSIS — Z923 Personal history of irradiation: Secondary | ICD-10-CM | POA: Diagnosis not present

## 2016-02-05 DIAGNOSIS — Z881 Allergy status to other antibiotic agents status: Secondary | ICD-10-CM | POA: Diagnosis not present

## 2016-02-05 DIAGNOSIS — C3492 Malignant neoplasm of unspecified part of left bronchus or lung: Secondary | ICD-10-CM | POA: Diagnosis present

## 2016-02-05 DIAGNOSIS — A419 Sepsis, unspecified organism: Secondary | ICD-10-CM | POA: Diagnosis present

## 2016-02-05 DIAGNOSIS — R079 Chest pain, unspecified: Secondary | ICD-10-CM | POA: Diagnosis present

## 2016-02-05 DIAGNOSIS — Z955 Presence of coronary angioplasty implant and graft: Secondary | ICD-10-CM | POA: Diagnosis not present

## 2016-02-05 DIAGNOSIS — Z79899 Other long term (current) drug therapy: Secondary | ICD-10-CM | POA: Diagnosis not present

## 2016-02-05 DIAGNOSIS — W19XXXA Unspecified fall, initial encounter: Secondary | ICD-10-CM | POA: Diagnosis present

## 2016-02-05 DIAGNOSIS — I2510 Atherosclerotic heart disease of native coronary artery without angina pectoris: Secondary | ICD-10-CM | POA: Diagnosis present

## 2016-02-05 DIAGNOSIS — J189 Pneumonia, unspecified organism: Secondary | ICD-10-CM

## 2016-02-05 DIAGNOSIS — Z87440 Personal history of urinary (tract) infections: Secondary | ICD-10-CM | POA: Diagnosis not present

## 2016-02-05 DIAGNOSIS — E43 Unspecified severe protein-calorie malnutrition: Secondary | ICD-10-CM | POA: Diagnosis present

## 2016-02-05 DIAGNOSIS — E2740 Unspecified adrenocortical insufficiency: Secondary | ICD-10-CM | POA: Diagnosis present

## 2016-02-05 DIAGNOSIS — E860 Dehydration: Secondary | ICD-10-CM | POA: Diagnosis present

## 2016-02-05 DIAGNOSIS — I951 Orthostatic hypotension: Secondary | ICD-10-CM | POA: Diagnosis present

## 2016-02-05 DIAGNOSIS — Z66 Do not resuscitate: Secondary | ICD-10-CM | POA: Diagnosis present

## 2016-02-05 DIAGNOSIS — Z7952 Long term (current) use of systemic steroids: Secondary | ICD-10-CM | POA: Diagnosis not present

## 2016-02-05 DIAGNOSIS — Z7982 Long term (current) use of aspirin: Secondary | ICD-10-CM | POA: Diagnosis not present

## 2016-02-05 LAB — BASIC METABOLIC PANEL
ANION GAP: 10 (ref 5–15)
BUN: 15 mg/dL (ref 6–20)
CALCIUM: 8.4 mg/dL — AB (ref 8.9–10.3)
CO2: 17 mmol/L — AB (ref 22–32)
Chloride: 110 mmol/L (ref 101–111)
Creatinine, Ser: 0.57 mg/dL — ABNORMAL LOW (ref 0.61–1.24)
GFR calc Af Amer: >60 mL/min (ref >60)
GFR calc non Af Amer: >60 mL/min (ref >60)
GLUCOSE: 94 mg/dL (ref 65–99)
Potassium: 3.7 mmol/L (ref 3.5–5.1)
Sodium: 137 mmol/L (ref 135–145)

## 2016-02-05 LAB — CBC
HCT: 34.1 % — ABNORMAL LOW (ref 39.0–52.0)
HEMOGLOBIN: 11.9 g/dL — AB (ref 13.0–17.0)
MCH: 30.4 pg (ref 26.0–34.0)
MCHC: 34.9 g/dL (ref 30.0–36.0)
MCV: 87 fL (ref 78.0–100.0)
Platelets: 211 10*3/uL (ref 150–400)
RBC: 3.92 MIL/uL — ABNORMAL LOW (ref 4.22–5.81)
RDW: 12.8 % (ref 11.5–15.5)
WBC: 6.2 10*3/uL (ref 4.0–10.5)

## 2016-02-05 LAB — URINE CULTURE: Culture: NO GROWTH

## 2016-02-05 MED ORDER — CALCIUM CARBONATE ANTACID 600 MG PO CHEW
CHEWABLE_TABLET | Freq: Every day | ORAL | Status: DC | PRN
Start: 2016-02-05 — End: 2016-02-05

## 2016-02-05 MED ORDER — AZITHROMYCIN 500 MG IV SOLR
500.0000 mg | INTRAVENOUS | Status: DC
  Administered 2016-02-05 – 2016-02-06 (×2): 500 mg via INTRAVENOUS
  Filled 2016-02-05 (×3): qty 500

## 2016-02-05 MED ORDER — FAMOTIDINE 20 MG PO TABS
20.0000 mg | ORAL_TABLET | Freq: Every day | ORAL | Status: DC
  Administered 2016-02-05 – 2016-02-07 (×3): 20 mg via ORAL
  Filled 2016-02-05 (×3): qty 1

## 2016-02-05 MED ORDER — CALCIUM CARBONATE ANTACID 600 MG PO CHEW: CHEWABLE_TABLET | Freq: Every day | ORAL | Status: DC | PRN

## 2016-02-05 MED ORDER — FLUDROCORTISONE ACETATE 0.1 MG PO TABS
0.1000 mg | ORAL_TABLET | Freq: Every day | ORAL | Status: DC
  Administered 2016-02-05 – 2016-02-07 (×3): 0.1 mg via ORAL
  Filled 2016-02-05 (×3): qty 1

## 2016-02-05 MED ORDER — CITALOPRAM HYDROBROMIDE 20 MG PO TABS
20.0000 mg | ORAL_TABLET | Freq: Every day | ORAL | Status: DC
  Administered 2016-02-05 – 2016-02-07 (×3): 20 mg via ORAL
  Filled 2016-02-05 (×3): qty 1

## 2016-02-05 MED ORDER — FINASTERIDE 5 MG PO TABS
5.0000 mg | ORAL_TABLET | Freq: Every day | ORAL | Status: DC
  Administered 2016-02-05 – 2016-02-07 (×3): 5 mg via ORAL
  Filled 2016-02-05 (×3): qty 1

## 2016-02-05 MED ORDER — MIDODRINE HCL 2.5 MG PO TABS
2.5000 mg | ORAL_TABLET | Freq: Every day | ORAL | Status: DC | PRN
  Filled 2016-02-05: qty 1

## 2016-02-05 MED ORDER — TAMSULOSIN HCL 0.4 MG PO CAPS
0.4000 mg | ORAL_CAPSULE | Freq: Every day | ORAL | Status: DC
  Administered 2016-02-05 – 2016-02-07 (×3): 0.4 mg via ORAL
  Filled 2016-02-05 (×3): qty 1

## 2016-02-05 MED ORDER — SODIUM CHLORIDE 0.9 % IV SOLN
INTRAVENOUS | Status: AC
  Administered 2016-02-05 (×2): via INTRAVENOUS

## 2016-02-05 MED ORDER — DEXTROSE 5 % IV SOLN
1.0000 g | INTRAVENOUS | Status: DC
  Administered 2016-02-05 – 2016-02-06 (×2): 1 g via INTRAVENOUS
  Filled 2016-02-05 (×3): qty 10

## 2016-02-05 MED ORDER — CALCIUM CARBONATE ANTACID 500 MG PO CHEW: 1.0000 | CHEWABLE_TABLET | Freq: Every day | ORAL | Status: DC | PRN

## 2016-02-05 MED ORDER — LEVOTHYROXINE SODIUM 25 MCG PO TABS
25.0000 ug | ORAL_TABLET | Freq: Every day | ORAL | Status: DC
Start: 2016-02-05 — End: 2016-02-07
  Administered 2016-02-05 – 2016-02-07 (×3): 25 ug via ORAL
  Filled 2016-02-05 (×3): qty 1

## 2016-02-05 NOTE — Progress Notes (Signed)
Initial Nutrition Assessment  DOCUMENTATION CODES:   Severe malnutrition in context of acute illness/injury  INTERVENTION:  - Continue Ensure Enlive po BID, each supplement provides 350 kcal and 20 grams of protein. - Will order Mighty Shake BID, each supplement provides 500 kcal and 23 grams of protein. - Continue to encourage PO intakes of meals and supplements.  - Recommend diet liberalization to Regular diet. - RD will continue to monitor for additional needs.  NUTRITION DIAGNOSIS:   Increased nutrient needs related to catabolic illness, cancer and cancer related treatments as evidenced by estimated needs.  GOAL:   Patient will meet greater than or equal to 90% of their needs  MONITOR:   PO intake, Supplement acceptance, Weight trends, Labs, I & O's  REASON FOR ASSESSMENT:   Malnutrition Screening Tool  ASSESSMENT:   75 y.o. male with medical history significant of stage IV left-sided lung cancer, had chemotherapy and radiation in January of this year and the whole brain radiation. He also had 2 stents about the same time he is on aspirin if he can for that. Patient came into the hospital because of chest pain. His wife at bedside and she contributed to the history taking. Patient is from [place], [place] and he is here visiting his daughter. For the past 2-3 days he has been they're weak, wobbly when he gets up, he had a fall this morning without hitting his head. He mentioned that he has some chest pain yesterday which he thought it was indigestion and he took Tums for that. The pain came back this morning and with the fall he came into the ED for further evaluation.  Pt seen for MST. BMI indicates overweight status. Pt sleeping during visit and all information provided by wife and daughter, who are at bedside. Pt's appetite has steadily decreased while previously undergoing treatment for cancer; pt currently not undergoing treatment. Pt has had taste alterations which family is  unable to describe at this time. They state that pt has cravings for "weird" combinations of foods. Family states that pt will desire a food item and once it is provided he may only take a few small bites. Wife states that pt eats oatmeal for breakfast most mornings and that she mixes [employer] into it to increase protein intake. Wife also provides pt with Muscle Milk or similar protein shakes.   Family is interested in diet liberalization due to poor PO intakes, wanting to keep pt comfortable. Family reports bringing pt favorite foods earlier today and that even with these items he only takes a few bites. RD in agreement with desire for diet liberalization to optimize PO intakes.   Physical assessment not performed with respect to pt's comfort. Wife reports that pt has lost 70 lbs since dx of lung cancer. Only other weight in chart than CBW is weight from 01/30/16 which shows 3 lb weight loss (2% body weight) since that date (6 days) which is significant for time frame. Pt meets criteria for severe malnutrition based on weight loss and <50% needed PO intakes for >5 days.  Medications reviewed; 50 mg IV Solu-Cortef TID, 25 mcg oral Synthroid/day, PRN Zofran, 40 mEq oral KCl x1 dose yesterday.  Labs reviewed; creatinine: 0.57 mg/dL, Ca: 8.4 mg/dL. IVF: NS @ 75 mL/hr.    Diet Order:  Diet Heart Room service appropriate? Yes; Fluid consistency: Thin  Skin:  Reviewed, no issues  Last BM:  7/25  Height:   Ht Readings from Last 1 Encounters:  02/04/16 '5\' 9"'$  (1.753 m)    Weight:   Wt Readings from Last 1 Encounters:  02/04/16 182 lb 5.1 oz (82.7 kg)    Ideal Body Weight:  72.73 kg (kg)  BMI:  Body mass index is 26.92 kg/m.  Estimated Nutritional Needs:   Kcal:  2100-2300  Protein:  105-115 grams  Fluid:  >/= 2 L/day  EDUCATION NEEDS:   No education needs identified at this time    Jarome Matin, MS, RD, LDN Inpatient Clinical Dietitian Pager #  [phone] After hours/weekend pager # [phone]

## 2016-02-05 NOTE — Care Management Obs Status (Signed)
[facility] NOTIFICATION   Patient Details  Name: Joseph Cross MRN: [id number] Date of Birth: [birth year]   Medicare Observation Status Notification Given:  Yes    Guadalupe Maple, RN 02/05/2016, 3:37 PM

## 2016-02-05 NOTE — Progress Notes (Signed)
PT Cancellation Note  Patient Details Name: Joseph Cross MRN: [id number] DOB: [birth year]   Cancelled Treatment:    Reason Eval/Treat Not Completed: PT screened, no needs identified, will sign off (wife reports that patient just ambulated to the bathroom and was steady.She declines need for PT at this time. )   Claretha Cooper 02/05/2016, 11:38 AM Tresa Endo PT [phone]

## 2016-02-05 NOTE — Progress Notes (Signed)
[facility] PROGRESS NOTE    Progress Note  [name]  CHY:[id number] DOB: 13-Jan-1941 DOA: 02/04/2016 PCP: PROVIDER NOT IN SYSTEM     Brief Narrative:   Sameul Cross is an 75 y.o. male past medical history significant for stage IV lung cancer, chemotherapy and radiation therapy to the brain, status post PCI on aspirin the comes into the hospital for 2-3 days of feeling weak and wobbly and hard to get up with a fall on the morning of admission.  Assessment/Plan:   Community-acquired pneumonia /Chest pain/SIRS (systemic inflammatory response syndrome) ([place]): She has no further chest pain, her EKG shows a normal sinus rhythm with no specific T-wave changes to set of cardiac enzymes are negative. D-dimer was positive proceeded with a CT scan here that showed no PE but it does show some ground-glass appearance, in the right lower lung, will get a 2-D echo. Continue IV antibiotics, she did have a temperature 100.8 with a respiration of 22 , she was given 2 L of normal saline Her blood pressure has remained stable, continue IV fluids.  Fall/Orthostatic hypotension:  [name] was orthostatic is likely multifactorial due to with hypoadrenalism and dehydration. Continue IV fluid hydration for an additional 12 hours recheck orthostatics in the morning, I agree with IV stress dose steroids. MRI is pending. Recheck orthostatics in am, see relative adrenal insufficiency for further details.  Adrenal insufficiency: She is on Florinef and prednisone chronically, due to her orthostatic hypotension and infectious etiology, cont  I agree stress dose steroids. Recheck orthostatic vitals in am  SCLC (small cell lung carcinoma) ([place]) Underwent chemotherapy and radiation therapy back in 2017, she is a full code  CAD in native artery Continue aspirin and efficient.    DVT prophylaxis: Heparin Family Communication:none Disposition Plan/Barrier to D/C: unable to  dtermine Code Status:     Code Status Orders        Start     Ordered   02/04/16 1621  Do not attempt resuscitation (DNR)  Continuous    Question Answer Comment  In the event of cardiac or respiratory ARREST Do not call a "code blue"   In the event of cardiac or respiratory ARREST Do not perform Intubation, CPR, defibrillation or ACLS   In the event of cardiac or respiratory ARREST Use medication by any route, position, wound care, and other measures to relive pain and suffering. May use oxygen, suction and manual treatment of airway obstruction as needed for comfort.      02/04/16 1620    Code Status History    Date Active Date Inactive Code Status Order ID Comments User Context   This patient has a current code status but no historical code status.    Advance Directive Documentation   Flowsheet Row Most Recent Value  Type of Advance Directive  Living will, Healthcare Power of Attorney  Pre-existing out of facility DNR order (yellow form or pink MOST form)  No data  "MOST" Form in Place?  No data        IV Access:    Peripheral IV   Procedures and diagnostic studies:   Dg Chest 2 View  Result Date: 02/04/2016 CLINICAL DATA:  Patient with generalized weakness for 2 days. Anterior chest pain and discomfort. EXAM: CHEST  2 VIEW COMPARISON:  None. FINDINGS: Monitoring leads overlie the patient. Patient is rotated to the left. Cardiomegaly. Pulmonary vascular redistribution. Bilateral perihilar interstitial pulmonary opacities. No definite pleural effusion. Thoracic spine degenerative changes. IMPRESSION:  Cardiomegaly. Bilateral perihilar interstitial opacities may represent mild component of pulmonary edema or chronic pulmonary changes. Atypical infection not excluded. Electronically Signed   By: Lovey Newcomer M.D.   On: 02/04/2016 13:13  Ct Angio Chest Pe W Or Wo Contrast  Result Date: 02/04/2016 CLINICAL DATA:  Status for left-sided lung cancer, now with chest pain. Evaluate  for pulmonary embolism. EXAM: CT ANGIOGRAPHY CHEST WITH CONTRAST TECHNIQUE: Multidetector CT imaging of the chest was performed using the standard protocol during bolus administration of intravenous contrast. Multiplanar CT image reconstructions and MIPs were obtained to evaluate the vascular anatomy. CONTRAST:  100 cc Isovue 370 COMPARISON:  Chest radiograph - 02/04/2016 FINDINGS: Vascular Findings: There is adequate opacification of the pulmonary arterial system with the main pulmonary artery measuring 287 Hounsfield units. There are no discrete filling defects within the pulmonary arterial tree to suggest pulmonary embolism. Normal caliber the main pulmonary artery. Normal heart size. Coronary artery calcifications. Normal caliber the of the thoracic aorta. No definite thoracic aortic dissection on this nongated examination. Scattered atherosclerotic plaque within the aortic arch. Conventional configuration of the aortic arch. The branch vessels of the aortic arch appear patent throughout their imaged course. Review of the MIP images confirms the above findings. ---------------------------------------------------------------------------------- Nonvascular Findings: Mediastinum/Lymph Nodes: Extensive mediastinal and hilar lymphadenopathy with index prevascular lymph node measuring 2.8 cm in greatest short axis diameter (image 32, series 5) and index infrahilar lymph node measuring 2.2 cm (image 49, series 5). There is an additional approximately 2.3 cm nodal conglomeration within the anterior mediastinum which results in mass effect upon the central aspect of the left innominate vein (representative image 31, series 5). Post left axillary lymphadenopathy with index node measuring 1.8 cm (image 32, series 5). Lungs/Pleura: Evaluation the pulmonary parenchyma is degraded secondary to patient respiratory artifact. There is a macro lobulated approximately 3.0 x 2.3 cm mass within the left upper lobe (image 30, series  5) compatible with provided history of bronchogenic carcinoma. Note is made of 2 adjacent satellite nodules within the left upper lobe measuring approximately 1.1 and 1.6 cm in diameter (both nodules seen on image 30, series 8). This dominant left upper lobe mass results in occlusion of 1 of the subsegmental bronchi of the left upper lobe (image 54, series 8). The remaining pulmonary airways appear patent. Dependent subpleural ground-glass atelectasis. There is ill-defined ground-glass about primarily the caudal aspect of this dominant pulmonary nodule/mass, potentially atelectasis though lymphangitic spread of tumor could result in a similar appearance. No pleural effusion or pneumothorax. Upper abdomen: Limited early arterial phase evaluation of the upper abdomen demonstrates cholelithiasis without definite evidence of cholecystitis. Musculoskeletal: No definite acute or aggressive osseous abnormalities. Regional soft tissues appear normal. Normal appearance of the thyroid gland. IMPRESSION: 1. No evidence of pulmonary embolism. 2. Findings compatible with provided history of stage for bronchogenic carcinoma with dominant macro lobulated left upper lobe mass measuring at least 3 cm in diameter with associated adjacent satellite nodules within left upper lobe and extensive mediastinal, hilar and left axillary lymphadenopathy. Note, dominant nodal conglomeration within the anterior mediastinum results in mass effect upon the central aspect of the left innominate vein without evidence of occlusion. 3. Ground-glass surrounding the caudal aspect of the dominant left upper lobe pulmonary mass as well as the left hilum may represent atelectasis though lymphangitic spread of tumor could result in a similar appearance. 4. Cholelithiasis without evidence cholecystitis. 5. Coronary artery calcifications. Aortic Atherosclerosis (ICD10-170.0) Electronically Signed   By: Eldridge Abrahams.D.  On: 02/04/2016 21:57  Dg Chest Port  1 View  Result Date: 02/04/2016 CLINICAL DATA:  Weakness, confusion. History of small cell lung cancer. EXAM: PORTABLE CHEST 1 VIEW COMPARISON:  02/04/2016 FINDINGS: Cardiomediastinal silhouette is normal. Mediastinal contours appear intact. There is increased interstitial markings in the left hemithorax with vague left subhilar soft tissue opacity no evidence of pneumothorax or radiographically apparent pleural effusions. Osseous structures are without acute abnormality. Soft tissues are grossly normal. IMPRESSION: Left infrahilar soft tissue density with thickening of the interstitial markings with central predominance in the left hemithorax, findings suspicious for primary lung malignancy with lymphangitic spread of disease. Please correlate to patient's prior imaging, if available. Otherwise, CT of the chest with contrast may be considered. Electronically Signed   By: Fidela Salisbury M.D.   On: 02/04/2016 17:23    Medical Consultants:    None.  Anti-Infectives:   Vancomycin and Zosyn started on 02/04/2016.  Subjective:    Quintavius Niebuhr relates she still feels weak and tired  Objective:    Vitals:   02/04/16 1530 02/04/16 1615 02/04/16 2033 02/05/16 0549  BP: 107/67 113/68 130/73 126/79  Pulse: 83 78 74 82  Resp: '13 16 18 20  '$ Temp:  98.1 F (36.7 C) 97.4 F (36.3 C) 97.4 F (36.3 C)  TempSrc:  Oral Oral Oral  SpO2: 97% 92% 96% 96%  Weight:  82.7 kg (182 lb 5.1 oz)    Height:  '5\' 9"'$  (1.753 m)      Intake/Output Summary (Last 24 hours) at 02/05/16 0753 Last data filed at 02/05/16 0543  Gross per 24 hour  Intake              940 ml  Output             1525 ml  Net             -585 ml   Filed Weights   02/04/16 1308 02/04/16 1615  Weight: 79.4 kg (175 lb) 82.7 kg (182 lb 5.1 oz)    Exam: General exam: In no acute distress. Respiratory system: Good air movement and clear to auscultation. Cardiovascular system: S1 & S2 heard, RRR. Gastrointestinal system:  Abdomen is nondistended, soft and nontender.  Central nervous system: Alert and oriented. No focal neurological deficits. Extremities: No pedal edema. Skin: No rashes, lesions or ulcers Psychiatry: Judgement and insight appear normal. Mood & affect appropriate.    Data Reviewed:    Labs: Basic Metabolic Panel:  Recent Labs Lab 02/04/16 1229 02/05/16 0455  NA 135 137  K 3.1* 3.7  CL 105 110  CO2 21* 17*  GLUCOSE 84 94  BUN 16 15  CREATININE 0.69 0.57*  CALCIUM 8.7* 8.4*   GFR Estimated Creatinine Clearance: 82.2 mL/min (by C-G formula based on SCr of 0.8 mg/dL). Liver Function Tests: No results for input(s): AST, ALT, ALKPHOS, BILITOT, PROT, ALBUMIN in the last 168 hours. No results for input(s): LIPASE, AMYLASE in the last 168 hours. No results for input(s): AMMONIA in the last 168 hours. Coagulation profile  Recent Labs Lab 02/04/16 1635  INR 1.14    CBC:  Recent Labs Lab 02/04/16 1229 02/05/16 0455  WBC 6.3 6.2  NEUTROABS 4.3  --   HGB 11.7* 11.9*  HCT 34.3* 34.1*  MCV 87.1 87.0  PLT 206 211   Cardiac Enzymes:  Recent Labs Lab 02/04/16 1635 02/04/16 2309  TROPONINI <0.03 <0.03   BNP (last 3 results) No results for input(s): PROBNP in the last  8760 hours. CBG: No results for input(s): GLUCAP in the last 168 hours. D-Dimer:  Recent Labs  02/04/16 1423  DDIMER 0.75*   Hgb A1c: No results for input(s): HGBA1C in the last 72 hours. Lipid Profile: No results for input(s): CHOL, HDL, LDLCALC, TRIG, CHOLHDL, LDLDIRECT in the last 72 hours. Thyroid function studies:  Recent Labs  02/04/16 1635  TSH 2.366   Anemia work up: No results for input(s): VITAMINB12, FOLATE, FERRITIN, TIBC, IRON, RETICCTPCT in the last 72 hours. Sepsis Labs:  Recent Labs Lab 02/04/16 1154 02/04/16 1229 02/04/16 1418 02/04/16 1635 02/05/16 0455  PROCALCITON  --   --   --  <0.10  --   WBC  --  6.3  --   --  6.2  LATICACIDVEN 1.95*  --  1.14  --   --     Microbiology Recent Results (from the past 240 hour(s))  Urine culture     Status: Abnormal   Collection Time: 01/30/16  5:00 PM  Result Value Ref Range Status   Specimen Description URINE, CATHETERIZED  Final   Special Requests NONE  Final   Culture MULTIPLE SPECIES PRESENT, SUGGEST RECOLLECTION (A)  Final   Report Status 01/31/2016 FINAL  Final  Culture, blood (routine x 2)     Status: None (Preliminary result)   Collection Time: 02/04/16  2:00 PM  Result Value Ref Range Status   Specimen Description   Final    BLOOD LEFT FOREARM Performed at [facility]    Special Requests BOTTLES DRAWN AEROBIC ONLY 5CC  Final   Culture PENDING  Incomplete   Report Status PENDING  Incomplete     Medications:   . aspirin EC  81 mg Oral Daily  . feeding supplement (ENSURE ENLIVE)  237 mL Oral BID BM  . heparin  5,000 Units Subcutaneous Q8H  . hydrocortisone sod succinate (SOLU-CORTEF) inj  50 mg Intravenous Q8H  . piperacillin-tazobactam (ZOSYN)  IV  3.375 g Intravenous Q8H  . prasugrel  10 mg Oral Daily  . sodium chloride flush  3 mL Intravenous Q12H  . vancomycin  1,000 mg Intravenous Q12H   Continuous Infusions: . sodium chloride 100 mL/hr at 02/04/16 1639    Time spent: 25 min   LOS: 0 days   Charlynne Cousins  [facility] Pager [phone]  *Please refer to [email or web address], password [email or web address] to get updated schedule on who will round on this patient, as hospitalists switch teams weekly. If 7PM-7AM, please contact night-coverage at [email or web address], password [email or web address] for any overnight needs.  02/05/2016, 7:53 AM

## 2016-02-05 NOTE — Progress Notes (Signed)
OT Cancellation Note  Patient Details Name: Joseph Cross MRN: [id number] DOB: 06-12-1941   Cancelled Treatment:    Reason Eval/Treat Not Completed: Other (comment). Wife does not feel pt needs any therapy.  They have been through a lot and she can assist him and has all DME covered from taking care of several family members.  Will sign off  Helio Lack 02/05/2016, 11:40 AM  Lesle Chris, OTR/L [phone] 02/05/2016

## 2016-02-06 DIAGNOSIS — E2740 Unspecified adrenocortical insufficiency: Secondary | ICD-10-CM

## 2016-02-06 DIAGNOSIS — J189 Pneumonia, unspecified organism: Secondary | ICD-10-CM

## 2016-02-06 DIAGNOSIS — I2510 Atherosclerotic heart disease of native coronary artery without angina pectoris: Secondary | ICD-10-CM

## 2016-02-06 DIAGNOSIS — I951 Orthostatic hypotension: Secondary | ICD-10-CM

## 2016-02-06 DIAGNOSIS — E43 Unspecified severe protein-calorie malnutrition: Secondary | ICD-10-CM | POA: Insufficient documentation

## 2016-02-06 LAB — BASIC METABOLIC PANEL
Anion gap: 6 (ref 5–15)
BUN: 12 mg/dL (ref 6–20)
CALCIUM: 9 mg/dL (ref 8.9–10.3)
CHLORIDE: 110 mmol/L (ref 101–111)
CO2: 22 mmol/L (ref 22–32)
CREATININE: 0.56 mg/dL — AB (ref 0.61–1.24)
GFR calc non Af Amer: >60 mL/min (ref >60)
Glucose, Bld: 126 mg/dL — ABNORMAL HIGH (ref 65–99)
Potassium: 3 mmol/L — ABNORMAL LOW (ref 3.5–5.1)
SODIUM: 138 mmol/L (ref 135–145)

## 2016-02-06 LAB — CBC
HCT: 32.3 % — ABNORMAL LOW (ref 39.0–52.0)
Hemoglobin: 11.3 g/dL — ABNORMAL LOW (ref 13.0–17.0)
MCH: 30.1 pg (ref 26.0–34.0)
MCHC: 35 g/dL (ref 30.0–36.0)
MCV: 86.1 fL (ref 78.0–100.0)
PLATELETS: 243 10*3/uL (ref 150–400)
RBC: 3.75 MIL/uL — AB (ref 4.22–5.81)
RDW: 12.9 % (ref 11.5–15.5)
WBC: 7.3 10*3/uL (ref 4.0–10.5)

## 2016-02-06 LAB — HEMOGLOBIN A1C
HEMOGLOBIN A1C: 5.1 % (ref 4.8–5.6)
Mean Plasma Glucose: 100 mg/dL

## 2016-02-06 MED ORDER — HYDROCORTISONE NA SUCCINATE PF 100 MG IJ SOLR
50.0000 mg | Freq: Two times a day (BID) | INTRAMUSCULAR | Status: DC
  Administered 2016-02-06 – 2016-02-07 (×2): 50 mg via INTRAVENOUS
  Filled 2016-02-06 (×2): qty 2

## 2016-02-06 MED ORDER — POTASSIUM CHLORIDE CRYS ER 20 MEQ PO TBCR
40.0000 meq | EXTENDED_RELEASE_TABLET | ORAL | Status: AC
  Administered 2016-02-06 (×2): 40 meq via ORAL
  Filled 2016-02-06 (×2): qty 2

## 2016-02-06 NOTE — Progress Notes (Signed)
PROGRESS NOTE  Joseph Cross XBW:[id number] DOB: 1940-10-24 DOA: 02/04/2016 PCP: PROVIDER NOT IN SYSTEM   LOS: 1 day   Brief Narrative: 75 y.o. male past medical history significant for stage IV lung cancer, chemotherapy and radiation therapy to the brain, status post PCI on aspirin the comes into the hospital for 2-3 days of feeling weak and wobbly and hard to get up with a fall on the morning of admission.  Assessment & Plan: Principal Problem:   Chest pain Active Problems:   SIRS (systemic inflammatory response syndrome) (HCC)   SCLC (small cell lung carcinoma) (HCC)   Orthostatic hypotension   Hypoadrenalism (HCC)   CAD in native artery   CAP (community acquired pneumonia)   Protein-calorie malnutrition, severe   Community-acquired pneumonia /Chest pain/SIRS (systemic inflammatory response syndrome) ([place]) - She has no further chest pain, her EKG shows a normal sinus rhythm with no specific T-wave changes to set of cardiac enzymes are negative. - D-dimer was positive proceeded with a CT scan here that showed no PE but it does show some ground-glass appearance, in the right lower lung, continue IV antibiotics for another day - fever curve improved, afebrile this morning   Fall/Orthostatic hypotension - [name] was orthostatic is likely multifactorial due to with hypoadrenalism and dehydration. - orthostasis resolved. Stop IVF  Adrenal insufficiency - he is on Florinef and prednisone chronically, due to orthostatic hypotension and infectious etiology - continue steroids, decrease dose however  SCLC (small cell lung carcinoma) ([place]) - Underwent chemotherapy and radiation therapy back in 2017  CAD in native artery - Continue aspirin and efficient.   DVT prophylaxis: SCDs Code Status: DNR Family Communication: wife bedside Disposition Plan: home 1 day  Consultants:   None   Procedures:   None   Antimicrobials:  Ceftriaxone 7/25  >>  Azithromycin 7/25 >>   Subjective: - feels better, more energy. More alert and less confused per wife.  Objective: Vitals:   02/05/16 1338 02/05/16 1340 02/05/16 2113 02/06/16 0500  BP: (!) 101/53  (!) 117/51 128/61  Pulse: 80  73 65  Resp: '16  18 18  '$ Temp: 99 F (37.2 C)  97.8 F (36.6 C) 97.6 F (36.4 C)  TempSrc: Oral  Oral Oral  SpO2: 94% 97% 97% 99%  Weight:      Height:        Intake/Output Summary (Last 24 hours) at 02/06/16 1024 Last data filed at 02/06/16 0545  Gross per 24 hour  Intake          1156.25 ml  Output             1375 ml  Net          -218.75 ml   Filed Weights   02/04/16 1308 02/04/16 1615  Weight: 79.4 kg (175 lb) 82.7 kg (182 lb 5.1 oz)    Examination: Constitutional: NAD Vitals:   02/05/16 1338 02/05/16 1340 02/05/16 2113 02/06/16 0500  BP: (!) 101/53  (!) 117/51 128/61  Pulse: 80  73 65  Resp: '16  18 18  '$ Temp: 99 F (37.2 C)  97.8 F (36.6 C) 97.6 F (36.4 C)  TempSrc: Oral  Oral Oral  SpO2: 94% 97% 97% 99%  Weight:      Height:       Respiratory: clear to auscultation bilaterally, no wheezing, no crackles. Cardiovascular: Regular rate and rhythm, no murmurs / rubs / gallops. No LE edema. Abdomen: no tenderness. Bowel sounds positive.  Musculoskeletal:  no clubbing / cyanosis. Skin: no rashes, lesions, ulcers.  Neurologic: non focal    Data Reviewed: I have personally reviewed following labs and imaging studies  CBC:  Recent Labs Lab 02/04/16 1229 02/05/16 0455 02/06/16 0553  WBC 6.3 6.2 7.3  NEUTROABS 4.3  --   --   HGB 11.7* 11.9* 11.3*  HCT 34.3* 34.1* 32.3*  MCV 87.1 87.0 86.1  PLT 206 211 379   Basic Metabolic Panel:  Recent Labs Lab 02/04/16 1229 02/05/16 0455 02/06/16 0553  NA 135 137 138  K 3.1* 3.7 3.0*  CL 105 110 110  CO2 21* 17* 22  GLUCOSE 84 94 126*  BUN '16 15 12  '$ CREATININE 0.69 0.57* 0.56*  CALCIUM 8.7* 8.4* 9.0   GFR: Estimated Creatinine Clearance: 82.2 mL/min (by C-G formula  based on SCr of 0.8 mg/dL). Liver Function Tests: No results for input(s): AST, ALT, ALKPHOS, BILITOT, PROT, ALBUMIN in the last 168 hours. No results for input(s): LIPASE, AMYLASE in the last 168 hours. No results for input(s): AMMONIA in the last 168 hours. Coagulation Profile:  Recent Labs Lab 02/04/16 1635  INR 1.14   Cardiac Enzymes:  Recent Labs Lab 02/04/16 1635 02/04/16 2309  TROPONINI <0.03 <0.03   BNP (last 3 results) No results for input(s): PROBNP in the last 8760 hours. HbA1C:  Recent Labs  02/04/16 1635  HGBA1C 5.1   CBG: No results for input(s): GLUCAP in the last 168 hours. Lipid Profile: No results for input(s): CHOL, HDL, LDLCALC, TRIG, CHOLHDL, LDLDIRECT in the last 72 hours. Thyroid Function Tests:  Recent Labs  02/04/16 1635  TSH 2.366   Anemia Panel: No results for input(s): VITAMINB12, FOLATE, FERRITIN, TIBC, IRON, RETICCTPCT in the last 72 hours. Urine analysis:    Component Value Date/Time   COLORURINE AMBER (A) 02/04/2016 1214   APPEARANCEUR CLOUDY (A) 02/04/2016 1214   LABSPEC 1.018 02/04/2016 1214   PHURINE 7.0 02/04/2016 1214   GLUCOSEU NEGATIVE 02/04/2016 1214   HGBUR SMALL (A) 02/04/2016 1214   BILIRUBINUR NEGATIVE 02/04/2016 1214   KETONESUR NEGATIVE 02/04/2016 1214   PROTEINUR NEGATIVE 02/04/2016 1214   NITRITE NEGATIVE 02/04/2016 1214   LEUKOCYTESUR SMALL (A) 02/04/2016 1214   Sepsis Labs: Invalid input(s): PROCALCITONIN, LACTICIDVEN  Recent Results (from the past 240 hour(s))  Urine culture     Status: Abnormal   Collection Time: 01/30/16  5:00 PM  Result Value Ref Range Status   Specimen Description URINE, CATHETERIZED  Final   Special Requests NONE  Final   Culture MULTIPLE SPECIES PRESENT, SUGGEST RECOLLECTION (A)  Final   Report Status 01/31/2016 FINAL  Final  Urine culture     Status: None   Collection Time: 02/04/16 12:14 PM  Result Value Ref Range Status   Specimen Description URINE, CLEAN CATCH  Final    Special Requests NONE  Final   Culture NO GROWTH Performed at [facility]   Final   Report Status 02/05/2016 FINAL  Final  Culture, blood (routine x 2)     Status: None (Preliminary result)   Collection Time: 02/04/16 12:25 PM  Result Value Ref Range Status   Specimen Description BLOOD LEFT ANTECUBITAL  Final   Special Requests BOTTLES DRAWN AEROBIC AND ANAEROBIC 5ML  Final   Culture   Final    NO GROWTH 1 DAY Performed at [facility]    Report Status PENDING  Incomplete  Culture, blood (routine x 2)     Status: None (Preliminary result)  Collection Time: 02/04/16  2:00 PM  Result Value Ref Range Status   Specimen Description BLOOD LEFT FOREARM  Final   Special Requests BOTTLES DRAWN AEROBIC ONLY 5CC  Final   Culture   Final    NO GROWTH < 24 HOURS Performed at [facility]    Report Status PENDING  Incomplete      Radiology Studies: Dg Chest 2 View  Result Date: 02/04/2016 CLINICAL DATA:  Patient with generalized weakness for 2 days. Anterior chest pain and discomfort. EXAM: CHEST  2 VIEW COMPARISON:  None. FINDINGS: Monitoring leads overlie the patient. Patient is rotated to the left. Cardiomegaly. Pulmonary vascular redistribution. Bilateral perihilar interstitial pulmonary opacities. No definite pleural effusion. Thoracic spine degenerative changes. IMPRESSION: Cardiomegaly. Bilateral perihilar interstitial opacities may represent mild component of pulmonary edema or chronic pulmonary changes. Atypical infection not excluded. Electronically Signed   By: Lovey Newcomer M.D.   On: 02/04/2016 13:13  Ct Angio Chest Pe W Or Wo Contrast  Result Date: 02/04/2016 CLINICAL DATA:  Status for left-sided lung cancer, now with chest pain. Evaluate for pulmonary embolism. EXAM: CT ANGIOGRAPHY CHEST WITH CONTRAST TECHNIQUE: Multidetector CT imaging of the chest was performed using the standard protocol during bolus administration of intravenous contrast. Multiplanar CT  image reconstructions and MIPs were obtained to evaluate the vascular anatomy. CONTRAST:  100 cc Isovue 370 COMPARISON:  Chest radiograph - 02/04/2016 FINDINGS: Vascular Findings: There is adequate opacification of the pulmonary arterial system with the main pulmonary artery measuring 287 Hounsfield units. There are no discrete filling defects within the pulmonary arterial tree to suggest pulmonary embolism. Normal caliber the main pulmonary artery. Normal heart size. Coronary artery calcifications. Normal caliber the of the thoracic aorta. No definite thoracic aortic dissection on this nongated examination. Scattered atherosclerotic plaque within the aortic arch. Conventional configuration of the aortic arch. The branch vessels of the aortic arch appear patent throughout their imaged course. Review of the MIP images confirms the above findings. ---------------------------------------------------------------------------------- Nonvascular Findings: Mediastinum/Lymph Nodes: Extensive mediastinal and hilar lymphadenopathy with index prevascular lymph node measuring 2.8 cm in greatest short axis diameter (image 32, series 5) and index infrahilar lymph node measuring 2.2 cm (image 49, series 5). There is an additional approximately 2.3 cm nodal conglomeration within the anterior mediastinum which results in mass effect upon the central aspect of the left innominate vein (representative image 31, series 5). Post left axillary lymphadenopathy with index node measuring 1.8 cm (image 32, series 5). Lungs/Pleura: Evaluation the pulmonary parenchyma is degraded secondary to patient respiratory artifact. There is a macro lobulated approximately 3.0 x 2.3 cm mass within the left upper lobe (image 30, series 5) compatible with provided history of bronchogenic carcinoma. Note is made of 2 adjacent satellite nodules within the left upper lobe measuring approximately 1.1 and 1.6 cm in diameter (both nodules seen on image 30, series  8). This dominant left upper lobe mass results in occlusion of 1 of the subsegmental bronchi of the left upper lobe (image 54, series 8). The remaining pulmonary airways appear patent. Dependent subpleural ground-glass atelectasis. There is ill-defined ground-glass about primarily the caudal aspect of this dominant pulmonary nodule/mass, potentially atelectasis though lymphangitic spread of tumor could result in a similar appearance. No pleural effusion or pneumothorax. Upper abdomen: Limited early arterial phase evaluation of the upper abdomen demonstrates cholelithiasis without definite evidence of cholecystitis. Musculoskeletal: No definite acute or aggressive osseous abnormalities. Regional soft tissues appear normal. Normal appearance of the thyroid gland. IMPRESSION: 1. No  evidence of pulmonary embolism. 2. Findings compatible with provided history of stage for bronchogenic carcinoma with dominant macro lobulated left upper lobe mass measuring at least 3 cm in diameter with associated adjacent satellite nodules within left upper lobe and extensive mediastinal, hilar and left axillary lymphadenopathy. Note, dominant nodal conglomeration within the anterior mediastinum results in mass effect upon the central aspect of the left innominate vein without evidence of occlusion. 3. Ground-glass surrounding the caudal aspect of the dominant left upper lobe pulmonary mass as well as the left hilum may represent atelectasis though lymphangitic spread of tumor could result in a similar appearance. 4. Cholelithiasis without evidence cholecystitis. 5. Coronary artery calcifications. Aortic Atherosclerosis (ICD10-170.0) Electronically Signed   By: Sandi Mariscal M.D.   On: 02/04/2016 21:57  Mr Brain Wo Contrast  Result Date: 02/05/2016 CLINICAL DATA:  Confusion.  History lung cancer. EXAM: MRI HEAD WITHOUT CONTRAST TECHNIQUE: Multiplanar, multiecho pulse sequences of the brain and surrounding structures were obtained  without intravenous contrast. COMPARISON:  None. FINDINGS: Moderate atrophy.  Negative for hydrocephalus. Negative for acute infarct. Diffuse white matter hyperintensity bilaterally is symmetric. This could be due to chemotherapy/ radiation effect. There may also be an element of chronic microvascular ischemia. Correlate with history of treatment. Negative for intracranial hemorrhage. No mass lesion identified on unenhanced imaging. Lack of intravenous contrast decreases sensitivity for detection of metastatic disease to the brain. Paranasal sinuses clear. Bilateral mastoid sinus effusion. Normal orbital structures. Normal calvarium. Normal pituitary. IMPRESSION: Negative for acute infarct. Negative for metastatic disease on unenhanced imaging. Lack of intravenous contrast does decrease sensitivity for detection of intracranial metastatic disease Diffuse bilateral white matter hyperintensity most likely due to chemo/ radiation and possibly chronic microvascular ischemia. Electronically Signed   By: Franchot Gallo M.D.   On: 02/05/2016 18:55  Dg Chest Port 1 View  Result Date: 02/04/2016 CLINICAL DATA:  Weakness, confusion. History of small cell lung cancer. EXAM: PORTABLE CHEST 1 VIEW COMPARISON:  02/04/2016 FINDINGS: Cardiomediastinal silhouette is normal. Mediastinal contours appear intact. There is increased interstitial markings in the left hemithorax with vague left subhilar soft tissue opacity no evidence of pneumothorax or radiographically apparent pleural effusions. Osseous structures are without acute abnormality. Soft tissues are grossly normal. IMPRESSION: Left infrahilar soft tissue density with thickening of the interstitial markings with central predominance in the left hemithorax, findings suspicious for primary lung malignancy with lymphangitic spread of disease. Please correlate to patient's prior imaging, if available. Otherwise, CT of the chest with contrast may be considered. Electronically  Signed   By: Fidela Salisbury M.D.   On: 02/04/2016 17:23    Scheduled Meds: . aspirin EC  81 mg Oral Daily  . azithromycin  500 mg Intravenous Q24H  . cefTRIAXone (ROCEPHIN)  IV  1 g Intravenous Q24H  . citalopram  20 mg Oral Daily  . famotidine  20 mg Oral Daily  . feeding supplement (ENSURE ENLIVE)  237 mL Oral BID BM  . finasteride  5 mg Oral Daily  . fludrocortisone  0.1 mg Oral Daily  . hydrocortisone sod succinate (SOLU-CORTEF) inj  50 mg Intravenous Q8H  . levothyroxine  25 mcg Oral QAC breakfast  . potassium chloride  40 mEq Oral Q3H  . prasugrel  10 mg Oral Daily  . sodium chloride flush  3 mL Intravenous Q12H  . tamsulosin  0.4 mg Oral Daily   Continuous Infusions:    Marzetta Board, MD, PhD [facility] Pager [phone]  If 7PM-7AM, please contact night-coverage  [email or web address] Password [facility] 02/06/2016, 10:24 AM

## 2016-02-07 LAB — BASIC METABOLIC PANEL
Anion gap: 5 (ref 5–15)
BUN: 12 mg/dL (ref 6–20)
CHLORIDE: 110 mmol/L (ref 101–111)
CO2: 24 mmol/L (ref 22–32)
CREATININE: 0.6 mg/dL — AB (ref 0.61–1.24)
Calcium: 8.9 mg/dL (ref 8.9–10.3)
GFR calc Af Amer: >60 mL/min (ref >60)
GLUCOSE: 109 mg/dL — AB (ref 65–99)
POTASSIUM: 3.6 mmol/L (ref 3.5–5.1)
Sodium: 139 mmol/L (ref 135–145)

## 2016-02-07 LAB — CBC
HEMATOCRIT: 30.7 % — AB (ref 39.0–52.0)
Hemoglobin: 10.7 g/dL — ABNORMAL LOW (ref 13.0–17.0)
MCH: 30.1 pg (ref 26.0–34.0)
MCHC: 34.9 g/dL (ref 30.0–36.0)
MCV: 86.5 fL (ref 78.0–100.0)
Platelets: 237 10*3/uL (ref 150–400)
RBC: 3.55 MIL/uL — ABNORMAL LOW (ref 4.22–5.81)
RDW: 12.8 % (ref 11.5–15.5)
WBC: 7 10*3/uL (ref 4.0–10.5)

## 2016-02-07 MED ORDER — LEVOFLOXACIN 500 MG PO TABS
500.0000 mg | ORAL_TABLET | Freq: Every day | ORAL | 0 refills | Status: AC
Start: 2016-02-07 — End: ?

## 2016-02-07 MED ORDER — PREDNISONE 20 MG PO TABS: 10.0000 mg | ORAL_TABLET | Freq: Every day | ORAL | 0 refills | Status: AC

## 2016-02-07 MED ORDER — FIRST-VANCOMYCIN 25 25 MG/ML PO SOLN: 125.0000 mg | Freq: Four times a day (QID) | ORAL | 0 refills | Status: AC

## 2016-02-07 MED ORDER — SACCHAROMYCES BOULARDII 250 MG PO CAPS: 250.0000 mg | ORAL_CAPSULE | Freq: Two times a day (BID) | ORAL | 0 refills | Status: AC

## 2016-02-07 NOTE — Discharge Summary (Addendum)
Physician Discharge Summary  Joseph Cross XFG:[id number] DOB: [birth year] DOA: 02/04/2016  PCP: PROVIDER NOT IN SYSTEM  Admit date: 02/04/2016 Discharge date: 02/07/2016  Admitted From: home Disposition:  home  Recommendations for Outpatient Follow-up:  1. Follow up with PCP in 1-2 weeks  Home Health: none  Equipment/Devices: none   Discharge Condition: stable CODE STATUS: DNR Diet recommendation: regular  HPI: 75 y.o.malepast medical history significant for stage IV lung cancer, chemotherapy and radiation therapy to the brain, status post PCI on aspirinthe comes into the hospital for 2-3 days of feeling weak and wobbly and hard to get up with a fall on the morning of admission.  Hospital Course: Discharge Diagnoses:  Principal Problem:   Chest pain Active Problems:   SIRS (systemic inflammatory response syndrome) (HCC)   SCLC (small cell lung carcinoma) (HCC)   Orthostatic hypotension   Hypoadrenalism (HCC)   CAD in native artery   CAP (community acquired pneumonia)   Protein-calorie malnutrition, severe  Sepsis due to Community-acquired pneumonia / Chest pain / - he has no further chest pain, his EKG shows a normal sinus rhythm with no specific T-wave changes, cardiac enzymes are negative. D-dimer was positive proceeded with a CT scan here that showed no PE but it does show some ground-glass appearance, in the right lower lung. Fever curve improved, afebrile this morning. He was maintained on IV antibiotics with improvement, transitioned to Levaquin and he is to complete 4 additional days.  Fall/Orthostatic hypotension - [name] was orthostatic is likely multifactorial due towith hypoadrenalism and dehydration. Orthostasis resolved.  Adrenal insufficiency - he is on Florinef and prednisone chronically, due to orthostatic hypotension and infectious etiology. Will need a taper SCLC (small cell lung carcinoma) ([place]) - Underwent chemotherapy and radiation  therapy.  CAD in native artery - Continue aspirin and efficient.    Discharge Instructions     Medication List    STOP taking these medications   cephALEXin 500 MG capsule Commonly known as:  KEFLEX     TAKE these medications   aspirin EC 81 MG tablet Take 81 mg by mouth daily.   citalopram 20 MG tablet Commonly known as:  CELEXA Take 20 mg by mouth daily.   finasteride 5 MG tablet Commonly known as:  PROSCAR Take 5 mg by mouth daily.   FIRST-VANCOMYCIN 25 25 MG/ML Soln Take 125 mg by mouth 4 (four) times daily.   fludrocortisone 0.1 MG tablet Commonly known as:  FLORINEF Take 0.1 mg by mouth daily.   levofloxacin 500 MG tablet Commonly known as:  LEVAQUIN Take 1 tablet (500 mg total) by mouth daily.   levothyroxine 25 MCG tablet Commonly known as:  SYNTHROID, LEVOTHROID Take 25 mcg by mouth daily before breakfast.   MAALOX PO Take 2 tablets by mouth daily as needed (indigestion).   midodrine 2.5 MG tablet Commonly known as:  PROAMATINE Take 2.5 mg by mouth daily as needed (if systolic blood pressure is under 100).   multivitamin with minerals Tabs tablet Take 1 tablet by mouth daily.   prasugrel 10 MG Tabs tablet Commonly known as:  EFFIENT Take 10 mg by mouth daily.   predniSONE 20 MG tablet Commonly known as:  DELTASONE Take 0.5 tablets (10 mg total) by mouth daily with breakfast. 40 mg daily for 2 days then 30 mg daily for 2 days then 20 mg daily for 2 days then resume 10 mg daily What changed:  additional instructions   ranitidine 150 MG tablet  Commonly known as:  ZANTAC Take 150 mg by mouth daily.   saccharomyces boulardii 250 MG capsule Commonly known as:  FLORASTOR Take 1 capsule (250 mg total) by mouth 2 (two) times daily.   tamsulosin 0.4 MG Caps capsule Commonly known as:  FLOMAX Take 0.4 mg by mouth daily.      Follow-up Information    Seth Bake. Schedule an appointment as soon as possible for a visit today.   Contact  information: pcp is Seth Bake [address], [place] [address] [phone]  Oncologist Dr Otelia Limes ? in [place] CV is Dr [name] at [address], [place] [address] [phone]          Allergies  Allergen Reactions  . Flagyl [Metronidazole] Hives and Rash    Consultations:  None   Procedures/Studies:  Dg Chest 2 View  Result Date: 02/04/2016 CLINICAL DATA:  Patient with generalized weakness for 2 days. Anterior chest pain and discomfort. EXAM: CHEST  2 VIEW COMPARISON:  None. FINDINGS: Monitoring leads overlie the patient. Patient is rotated to the left. Cardiomegaly. Pulmonary vascular redistribution. Bilateral perihilar interstitial pulmonary opacities. No definite pleural effusion. Thoracic spine degenerative changes. IMPRESSION: Cardiomegaly. Bilateral perihilar interstitial opacities may represent mild component of pulmonary edema or chronic pulmonary changes. Atypical infection not excluded. Electronically Signed   By: Lovey Newcomer M.D.   On: 02/04/2016 13:13  Ct Angio Chest Pe W Or Wo Contrast  Result Date: 02/04/2016 CLINICAL DATA:  Status for left-sided lung cancer, now with chest pain. Evaluate for pulmonary embolism. EXAM: CT ANGIOGRAPHY CHEST WITH CONTRAST TECHNIQUE: Multidetector CT imaging of the chest was performed using the standard protocol during bolus administration of intravenous contrast. Multiplanar CT image reconstructions and MIPs were obtained to evaluate the vascular anatomy. CONTRAST:  100 cc Isovue 370 COMPARISON:  Chest radiograph - 02/04/2016 FINDINGS: Vascular Findings: There is adequate opacification of the pulmonary arterial system with the main pulmonary artery measuring 287 Hounsfield units. There are no discrete filling defects within the pulmonary arterial tree to suggest pulmonary embolism. Normal caliber the main pulmonary artery. Normal heart size. Coronary artery  calcifications. Normal caliber the of the thoracic aorta. No definite thoracic aortic dissection on this nongated examination. Scattered atherosclerotic plaque within the aortic arch. Conventional configuration of the aortic arch. The branch vessels of the aortic arch appear patent throughout their imaged course. Review of the MIP images confirms the above findings. ---------------------------------------------------------------------------------- Nonvascular Findings: Mediastinum/Lymph Nodes: Extensive mediastinal and hilar lymphadenopathy with index prevascular lymph node measuring 2.8 cm in greatest short axis diameter (image 32, series 5) and index infrahilar lymph node measuring 2.2 cm (image 49, series 5). There is an additional approximately 2.3 cm nodal conglomeration within the anterior mediastinum which results in mass effect upon the central aspect of the left innominate vein (representative image 31, series 5). Post left axillary lymphadenopathy with index node measuring 1.8 cm (image 32, series 5). Lungs/Pleura: Evaluation the pulmonary parenchyma is degraded secondary to patient respiratory artifact. There is a macro lobulated approximately 3.0 x 2.3 cm mass within the left upper lobe (image 30, series 5) compatible with provided history of bronchogenic carcinoma. Note is made of 2 adjacent satellite nodules within the left upper lobe measuring approximately 1.1 and 1.6 cm in diameter (both nodules seen on image 30, series 8). This dominant left upper lobe mass results in occlusion of 1 of the subsegmental bronchi of the  left upper lobe (image 54, series 8). The remaining pulmonary airways appear patent. Dependent subpleural ground-glass atelectasis. There is ill-defined ground-glass about primarily the caudal aspect of this dominant pulmonary nodule/mass, potentially atelectasis though lymphangitic spread of tumor could result in a similar appearance. No pleural effusion or pneumothorax. Upper  abdomen: Limited early arterial phase evaluation of the upper abdomen demonstrates cholelithiasis without definite evidence of cholecystitis. Musculoskeletal: No definite acute or aggressive osseous abnormalities. Regional soft tissues appear normal. Normal appearance of the thyroid gland. IMPRESSION: 1. No evidence of pulmonary embolism. 2. Findings compatible with provided history of stage for bronchogenic carcinoma with dominant macro lobulated left upper lobe mass measuring at least 3 cm in diameter with associated adjacent satellite nodules within left upper lobe and extensive mediastinal, hilar and left axillary lymphadenopathy. Note, dominant nodal conglomeration within the anterior mediastinum results in mass effect upon the central aspect of the left innominate vein without evidence of occlusion. 3. Ground-glass surrounding the caudal aspect of the dominant left upper lobe pulmonary mass as well as the left hilum may represent atelectasis though lymphangitic spread of tumor could result in a similar appearance. 4. Cholelithiasis without evidence cholecystitis. 5. Coronary artery calcifications. Aortic Atherosclerosis (ICD10-170.0) Electronically Signed   By: Sandi Mariscal M.D.   On: 02/04/2016 21:57  Mr Brain Wo Contrast  Result Date: 02/05/2016 CLINICAL DATA:  Confusion.  History lung cancer. EXAM: MRI HEAD WITHOUT CONTRAST TECHNIQUE: Multiplanar, multiecho pulse sequences of the brain and surrounding structures were obtained without intravenous contrast. COMPARISON:  None. FINDINGS: Moderate atrophy.  Negative for hydrocephalus. Negative for acute infarct. Diffuse white matter hyperintensity bilaterally is symmetric. This could be due to chemotherapy/ radiation effect. There may also be an element of chronic microvascular ischemia. Correlate with history of treatment. Negative for intracranial hemorrhage. No mass lesion identified on unenhanced imaging. Lack of intravenous contrast decreases sensitivity  for detection of metastatic disease to the brain. Paranasal sinuses clear. Bilateral mastoid sinus effusion. Normal orbital structures. Normal calvarium. Normal pituitary. IMPRESSION: Negative for acute infarct. Negative for metastatic disease on unenhanced imaging. Lack of intravenous contrast does decrease sensitivity for detection of intracranial metastatic disease Diffuse bilateral white matter hyperintensity most likely due to chemo/ radiation and possibly chronic microvascular ischemia. Electronically Signed   By: Franchot Gallo M.D.   On: 02/05/2016 18:55  Dg Chest Port 1 View  Result Date: 02/04/2016 CLINICAL DATA:  Weakness, confusion. History of small cell lung cancer. EXAM: PORTABLE CHEST 1 VIEW COMPARISON:  02/04/2016 FINDINGS: Cardiomediastinal silhouette is normal. Mediastinal contours appear intact. There is increased interstitial markings in the left hemithorax with vague left subhilar soft tissue opacity no evidence of pneumothorax or radiographically apparent pleural effusions. Osseous structures are without acute abnormality. Soft tissues are grossly normal. IMPRESSION: Left infrahilar soft tissue density with thickening of the interstitial markings with central predominance in the left hemithorax, findings suspicious for primary lung malignancy with lymphangitic spread of disease. Please correlate to patient's prior imaging, if available. Otherwise, CT of the chest with contrast may be considered. Electronically Signed   By: Fidela Salisbury M.D.   On: 02/04/2016 17:23     Subjective: - no chest pain, shortness of breath, no abdominal pain, nausea or vomiting.   Discharge Exam: Vitals:   02/06/16 2131 02/07/16 0450  BP: 137/82 (!) 151/86  Pulse: 65 61  Resp: 18 18  Temp: 97.7 F (36.5 C) 97.6 F (36.4 C)   Vitals:   02/06/16 0500 02/06/16 1430 02/06/16 2131 02/07/16 0450  BP: 128/61 129/76 137/82 (!) 151/86  Pulse: 65 (!) 56 65 61  Resp: '18 20 18 18  '$ Temp: 97.6 F  (36.4 C) 97.6 F (36.4 C) 97.7 F (36.5 C) 97.6 F (36.4 C)  TempSrc: Oral Oral Oral Oral  SpO2: 99% 99% 96%   Weight:      Height:        General: Pt is alert, awake, not in acute distress Cardiovascular: RRR, S1/S2 +, no rubs, no gallops Respiratory: CTA bilaterally, no wheezing, no rhonchi Abdominal: Soft, NT, ND, bowel sounds + Extremities: no edema, no cyanosis    The results of significant diagnostics from this hospitalization (including imaging, microbiology, ancillary and laboratory) are listed below for reference.     Microbiology: Recent Results (from the past 240 hour(s))  Urine culture     Status: Abnormal   Collection Time: 01/30/16  5:00 PM  Result Value Ref Range Status   Specimen Description URINE, CATHETERIZED  Final   Special Requests NONE  Final   Culture MULTIPLE SPECIES PRESENT, SUGGEST RECOLLECTION (A)  Final   Report Status 01/31/2016 FINAL  Final  Urine culture     Status: None   Collection Time: 02/04/16 12:14 PM  Result Value Ref Range Status   Specimen Description URINE, CLEAN CATCH  Final   Special Requests NONE  Final   Culture NO GROWTH Performed at [facility]   Final   Report Status 02/05/2016 FINAL  Final  Culture, blood (routine x 2)     Status: None (Preliminary result)   Collection Time: 02/04/16 12:25 PM  Result Value Ref Range Status   Specimen Description BLOOD LEFT ANTECUBITAL  Final   Special Requests BOTTLES DRAWN AEROBIC AND ANAEROBIC 5ML  Final   Culture   Final    NO GROWTH 2 DAYS Performed at [facility]    Report Status PENDING  Incomplete  Culture, blood (routine x 2)     Status: None (Preliminary result)   Collection Time: 02/04/16  2:00 PM  Result Value Ref Range Status   Specimen Description BLOOD LEFT FOREARM  Final   Special Requests BOTTLES DRAWN AEROBIC ONLY 5CC  Final   Culture   Final    NO GROWTH 2 DAYS Performed at [facility]    Report Status PENDING  Incomplete      Labs: BNP (last 3 results) No results for input(s): BNP in the last 8760 hours. Basic Metabolic Panel:  Recent Labs Lab 02/04/16 1229 02/05/16 0455 02/06/16 0553 02/07/16 0537  NA 135 137 138 139  K 3.1* 3.7 3.0* 3.6  CL 105 110 110 110  CO2 21* 17* 22 24  GLUCOSE 84 94 126* 109*  BUN '16 15 12 12  '$ CREATININE 0.69 0.57* 0.56* 0.60*  CALCIUM 8.7* 8.4* 9.0 8.9   Liver Function Tests: No results for input(s): AST, ALT, ALKPHOS, BILITOT, PROT, ALBUMIN in the last 168 hours. No results for input(s): LIPASE, AMYLASE in the last 168 hours. No results for input(s): AMMONIA in the last 168 hours. CBC:  Recent Labs Lab 02/04/16 1229 02/05/16 0455 02/06/16 0553 02/07/16 0537  WBC 6.3 6.2 7.3 7.0  NEUTROABS 4.3  --   --   --   HGB 11.7* 11.9* 11.3* 10.7*  HCT 34.3* 34.1* 32.3* 30.7*  MCV 87.1 87.0 86.1 86.5  PLT 206 211 243 237   Cardiac Enzymes:  Recent Labs Lab 02/04/16 1635 02/04/16 2309  TROPONINI <0.03 <0.03   BNP: Invalid input(s): POCBNP  CBG: No results for input(s): GLUCAP in the last 168 hours. D-Dimer  Recent Labs  02/04/16 1423  DDIMER 0.75*   Hgb A1c  Recent Labs  02/04/16 1635  HGBA1C 5.1   Lipid Profile No results for input(s): CHOL, HDL, LDLCALC, TRIG, CHOLHDL, LDLDIRECT in the last 72 hours. Thyroid function studies  Recent Labs  02/04/16 1635  TSH 2.366   Anemia work up No results for input(s): VITAMINB12, FOLATE, FERRITIN, TIBC, IRON, RETICCTPCT in the last 72 hours. Urinalysis    Component Value Date/Time   COLORURINE AMBER (A) 02/04/2016 1214   APPEARANCEUR CLOUDY (A) 02/04/2016 1214   LABSPEC 1.018 02/04/2016 1214   PHURINE 7.0 02/04/2016 1214   GLUCOSEU NEGATIVE 02/04/2016 1214   HGBUR SMALL (A) 02/04/2016 1214   BILIRUBINUR NEGATIVE 02/04/2016 1214   KETONESUR NEGATIVE 02/04/2016 1214   PROTEINUR NEGATIVE 02/04/2016 1214   NITRITE NEGATIVE 02/04/2016 1214   LEUKOCYTESUR SMALL (A) 02/04/2016 1214   Sepsis  Labs Invalid input(s): PROCALCITONIN,  WBC,  LACTICIDVEN Microbiology Recent Results (from the past 240 hour(s))  Urine culture     Status: Abnormal   Collection Time: 01/30/16  5:00 PM  Result Value Ref Range Status   Specimen Description URINE, CATHETERIZED  Final   Special Requests NONE  Final   Culture MULTIPLE SPECIES PRESENT, SUGGEST RECOLLECTION (A)  Final   Report Status 01/31/2016 FINAL  Final  Urine culture     Status: None   Collection Time: 02/04/16 12:14 PM  Result Value Ref Range Status   Specimen Description URINE, CLEAN CATCH  Final   Special Requests NONE  Final   Culture NO GROWTH Performed at [facility]   Final   Report Status 02/05/2016 FINAL  Final  Culture, blood (routine x 2)     Status: None (Preliminary result)   Collection Time: 02/04/16 12:25 PM  Result Value Ref Range Status   Specimen Description BLOOD LEFT ANTECUBITAL  Final   Special Requests BOTTLES DRAWN AEROBIC AND ANAEROBIC 5ML  Final   Culture   Final    NO GROWTH 2 DAYS Performed at [facility]    Report Status PENDING  Incomplete  Culture, blood (routine x 2)     Status: None (Preliminary result)   Collection Time: 02/04/16  2:00 PM  Result Value Ref Range Status   Specimen Description BLOOD LEFT FOREARM  Final   Special Requests BOTTLES DRAWN AEROBIC ONLY 5CC  Final   Culture   Final    NO GROWTH 2 DAYS Performed at [facility]    Report Status PENDING  Incomplete     Time coordinating discharge: Over 30 minutes  SIGNED:  Marzetta Board, MD  [facility] 02/07/2016, 12:26 PM Pager [phone]  If 7PM-7AM, please contact night-coverage [email or web address] Password [email or web address]

## 2016-02-07 NOTE — Care Management Note (Signed)
Case Management Note  Patient Details  Name: Joseph Cross MRN: [id number] Date of Birth: [birth year]  Subjective/Objective:                    Action/Plan:d/c home no needs or orders.   Expected Discharge Date:   (unknown)               Expected Discharge Plan:  Home/Self Care  In-House Referral:     Discharge planning Services  CM Consult  Post Acute Care Choice:    Choice offered to:     DME Arranged:    DME Agency:     HH Arranged:    [place] Agency:     Status of Service:  Completed, signed off  If discussed at [employer] of Stay Meetings, dates discussed:    Additional Comments:  Dessa Phi, RN 02/07/2016, 10:41 AM

## 2016-02-09 LAB — CULTURE, BLOOD (ROUTINE X 2)
CULTURE: NO GROWTH
Culture: NO GROWTH

## 2016-02-11 ENCOUNTER — Emergency Department (HOSPITAL_COMMUNITY)
Admission: EM | Admit: 2016-02-11 | Discharge: 2016-02-11 | Disposition: A | Discharge status: Home / Self Care | Payer: Medicare Other | Attending: Emergency Medicine | Admitting: Emergency Medicine

## 2016-02-11 ENCOUNTER — Encounter (HOSPITAL_COMMUNITY): Payer: Self-pay | Admitting: Emergency Medicine

## 2016-02-11 ENCOUNTER — Emergency Department (HOSPITAL_COMMUNITY): Payer: Medicare Other

## 2016-02-11 DIAGNOSIS — Z955 Presence of coronary angioplasty implant and graft: Secondary | ICD-10-CM | POA: Diagnosis not present

## 2016-02-11 DIAGNOSIS — I959 Hypotension, unspecified: Secondary | ICD-10-CM

## 2016-02-11 DIAGNOSIS — Z85118 Personal history of other malignant neoplasm of bronchus and lung: Secondary | ICD-10-CM | POA: Diagnosis not present

## 2016-02-11 DIAGNOSIS — I2510 Atherosclerotic heart disease of native coronary artery without angina pectoris: Secondary | ICD-10-CM | POA: Diagnosis not present

## 2016-02-11 DIAGNOSIS — Z79899 Other long term (current) drug therapy: Secondary | ICD-10-CM | POA: Insufficient documentation

## 2016-02-11 DIAGNOSIS — I9589 Other hypotension: Secondary | ICD-10-CM | POA: Insufficient documentation

## 2016-02-11 DIAGNOSIS — Z7982 Long term (current) use of aspirin: Secondary | ICD-10-CM | POA: Insufficient documentation

## 2016-02-11 DIAGNOSIS — E039 Hypothyroidism, unspecified: Secondary | ICD-10-CM | POA: Insufficient documentation

## 2016-02-11 DIAGNOSIS — E876 Hypokalemia: Secondary | ICD-10-CM

## 2016-02-11 DIAGNOSIS — R531 Weakness: Secondary | ICD-10-CM | POA: Diagnosis present

## 2016-02-11 LAB — I-STAT TROPONIN, ED: TROPONIN I, POC: 0.01 ng/mL (ref 0.00–0.08)

## 2016-02-11 LAB — URINALYSIS, ROUTINE W REFLEX MICROSCOPIC
BILIRUBIN URINE: NEGATIVE
GLUCOSE, UA: NEGATIVE mg/dL
Hgb urine dipstick: NEGATIVE
KETONES UR: NEGATIVE mg/dL
NITRITE: NEGATIVE
PH: 7.5 (ref 5.0–8.0)
PROTEIN: NEGATIVE mg/dL
Specific Gravity, Urine: 1.018 (ref 1.005–1.030)

## 2016-02-11 LAB — CBC WITH DIFFERENTIAL/PLATELET
BASOS ABS: 0 10*3/uL (ref 0.0–0.1)
BASOS PCT: 0 %
Eosinophils Absolute: 0.2 10*3/uL (ref 0.0–0.7)
Eosinophils Relative: 2 %
HEMATOCRIT: 35 % — AB (ref 39.0–52.0)
HEMOGLOBIN: 12.1 g/dL — AB (ref 13.0–17.0)
Lymphocytes Relative: 18 %
Lymphs Abs: 1.4 10*3/uL (ref 0.7–4.0)
MCH: 30.4 pg (ref 26.0–34.0)
MCHC: 34.6 g/dL (ref 30.0–36.0)
MCV: 87.9 fL (ref 78.0–100.0)
Monocytes Absolute: 0.5 10*3/uL (ref 0.1–1.0)
Monocytes Relative: 7 %
NEUTROS ABS: 5.6 10*3/uL (ref 1.7–7.7)
NEUTROS PCT: 73 %
Platelets: 296 10*3/uL (ref 150–400)
RBC: 3.98 MIL/uL — AB (ref 4.22–5.81)
RDW: 13.1 % (ref 11.5–15.5)
WBC: 7.7 10*3/uL (ref 4.0–10.5)

## 2016-02-11 LAB — URINE MICROSCOPIC-ADD ON

## 2016-02-11 LAB — COMPREHENSIVE METABOLIC PANEL
ALK PHOS: 68 U/L (ref 38–126)
ALT: 27 U/L (ref 17–63)
AST: 25 U/L (ref 15–41)
Albumin: 3.5 g/dL (ref 3.5–5.0)
Anion gap: 7 (ref 5–15)
BILIRUBIN TOTAL: 0.5 mg/dL (ref 0.3–1.2)
BUN: 17 mg/dL (ref 6–20)
CALCIUM: 9.1 mg/dL (ref 8.9–10.3)
CO2: 29 mmol/L (ref 22–32)
CREATININE: 0.79 mg/dL (ref 0.61–1.24)
Chloride: 104 mmol/L (ref 101–111)
GFR calc non Af Amer: >60 mL/min (ref >60)
GLUCOSE: 99 mg/dL (ref 65–99)
Potassium: 2.7 mmol/L — CL (ref 3.5–5.1)
SODIUM: 140 mmol/L (ref 135–145)
TOTAL PROTEIN: 6.8 g/dL (ref 6.5–8.1)

## 2016-02-11 LAB — I-STAT CG4 LACTIC ACID, ED: Lactic Acid, Venous: 1.86 mmol/L (ref 0.5–1.9)

## 2016-02-11 MED ORDER — POTASSIUM CHLORIDE CRYS ER 20 MEQ PO TBCR: 20.0000 meq | EXTENDED_RELEASE_TABLET | Freq: Two times a day (BID) | ORAL | 0 refills | Status: AC

## 2016-02-11 MED ORDER — POTASSIUM CHLORIDE CRYS ER 20 MEQ PO TBCR
40.0000 meq | EXTENDED_RELEASE_TABLET | Freq: Once | ORAL | Status: AC
  Administered 2016-02-11: 40 meq via ORAL
  Filled 2016-02-11: qty 2

## 2016-02-11 NOTE — ED Triage Notes (Signed)
Pt sent from an UC. Pt was recently discharged from the hospital after PNA and UTI. Found bruise on LLQ so he went to an urgent care for evaluation. While there pt had an episode of weakness, clamminess and hypotension. Pt BP 85/51 in triage. Family gave pt minodine to raise BP just prior to arrival. Hx of stage 4 lung cancer, not on chemo.

## 2016-02-11 NOTE — ED Provider Notes (Signed)
[facility] DEPT Provider Note   CSN: [id number] Arrival date & time: 02/11/16  1214  First Provider Contact:  First MD Initiated Contact with Patient 02/11/16 1247        History   Chief Complaint Chief Complaint  Patient presents with  . Hypotension    HPI Joseph Cross is a 75 y.o. male.  Patient presents for evaluation of episode of hypotension, which occurred during his evaluation at an urgent care center today. Because of the hypotension, his wife gave him a dose of midodrine which she keeps around to take for blood pressures, which are less than 128 systolic. She uses this rarely. He was discharged from the hospital yesterday. During the hospitalization he was treated for chest pain, evaluate for infection, and was treated for community-acquired pneumonia. He also received treatment for orthostatic hypotension, and adrenal insufficiency as well as hyperkalemia during the visit. He plans to go back home to [place] state, today, but decided to go to an urgent care clinic earlier today to be evaluated for some discoloration of the left lower abdominal wall. No known trauma to this area, but he apparently did get injections in the abdomen. During the hospitalization. There's been no fever, chills, vomiting, chest pain or cough, since the hospital discharge. There are no other known modifying factors.  HPI  Past Medical History:  Diagnosis Date  . Cancer ([place])    Lung Cancer with METS  . Hypothyroidism     Patient Active Problem List   Diagnosis Date Noted  . Protein-calorie malnutrition, severe 02/06/2016  . CAP (community acquired pneumonia) 02/05/2016  . SIRS (systemic inflammatory response syndrome) ([place]) 02/04/2016  . Chest pain 02/04/2016  . 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 Orthostatic hypotension 02/04/2016  . 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 CAD in native artery 02/04/2016    Past Surgical History:  Procedure Laterality Date  . cardiac  stents         Home Medications    Prior to Admission medications   Medication Sig Start Date End Date Taking? Authorizing Provider  aspirin EC 81 MG tablet Take 81 mg by mouth daily.   Yes Historical Provider, MD  Calcium Carbonate Antacid (MAALOX PO) Take 2 tablets by mouth daily as needed (indigestion).   Yes Historical Provider, MD  citalopram (CELEXA) 20 MG tablet Take 20 mg by mouth daily.   Yes Historical Provider, MD  finasteride (PROSCAR) 5 MG tablet Take 5 mg by mouth daily.   Yes Historical Provider, MD  fludrocortisone (FLORINEF) 0.1 MG tablet Take 0.1 mg by mouth daily.   Yes Historical Provider, MD  levofloxacin (LEVAQUIN) 500 MG tablet Take 1 tablet (500 mg total) by mouth daily. Patient taking differently: Take 500 mg by mouth daily. Started 07/27 for 4 days 02/07/16  Yes Costin Karlyne Greenspan, MD  levothyroxine (SYNTHROID, LEVOTHROID) 25 MCG tablet Take 25 mcg by mouth daily before breakfast.   Yes Historical Provider, MD  midodrine (PROAMATINE) 2.5 MG tablet Take 2.5 mg by mouth daily as needed (if systolic blood pressure is under 100).    Yes Historical Provider, MD  Multiple Vitamin (MULTIVITAMIN WITH MINERALS) TABS tablet Take 1 tablet by mouth daily.   Yes Historical Provider, MD  prasugrel (EFFIENT) 10 MG TABS tablet Take 10 mg by mouth daily.   Yes Historical Provider, MD  predniSONE (DELTASONE) 20 MG tablet Take 0.5 tablets (10 mg total) by mouth daily with breakfast. 40 mg daily for 2 days then 30  mg daily for 2 days then 20 mg daily for 2 days then resume 10 mg daily 02/07/16  Yes Costin Karlyne Greenspan, MD  ranitidine (ZANTAC) 150 MG tablet Take 150 mg by mouth daily.   Yes Historical Provider, MD  saccharomyces boulardii (FLORASTOR) 250 MG capsule Take 1 capsule (250 mg total) by mouth 2 (two) times daily. 02/07/16  Yes Costin Karlyne Greenspan, MD  tamsulosin (FLOMAX) 0.4 MG CAPS capsule Take 0.4 mg by mouth daily.   Yes Historical Provider, MD  Vancomycin HCl (FIRST-VANCOMYCIN 25)  25 MG/ML SOLN Take 125 mg by mouth 4 (four) times daily. Patient not taking: Reported on 02/11/2016 02/07/16   Caren Griffins, MD    Family History History reviewed. No pertinent family history.  Social History Social History  Substance Use Topics  . Smoking status: Never Smoker  . Smokeless tobacco: Never Used  . Alcohol use No     Allergies   Adhesive [tape] and Flagyl [metronidazole]   Review of Systems Review of Systems  All other systems reviewed and are negative.    Physical Exam Updated Vital Signs BP 105/61   Pulse (!) 58   Temp 97.5 F (36.4 C) (Oral)   Resp 22   Ht '5\' 10"'$  (1.778 m)   Wt 172 lb (78 kg)   SpO2 94%   BMI 24.68 kg/m   Physical Exam  Constitutional: He appears well-developed. No distress.  Elderly, frail  HENT:  Head: Normocephalic and atraumatic.  Right Ear: External ear normal.  Left Ear: External ear normal.  Eyes: Conjunctivae and EOM are normal. Pupils are equal, round, and reactive to light.  Neck: Normal range of motion and phonation normal. Neck supple.  Cardiovascular: Normal rate, regular rhythm and normal heart sounds.   Pulmonary/Chest: Effort normal and breath sounds normal. He exhibits no bony tenderness.  Abdominal: Soft. There is no tenderness.  Musculoskeletal: Normal range of motion.  Neurological: He is alert. No cranial nerve deficit or sensory deficit. He exhibits normal muscle tone. Coordination normal.  Skin: Skin is warm, dry and intact. No rash noted.  Left lower abdominal wall ecchymotic region about 5 x 8 cm, with central puncture consistent with localized hematoma. Right lower abdominal wall with a yellowish discoloration, without swelling, also with a puncture consistent with a very small amount of subcutaneous blood. Neither areas of discoloration are tender to palpation.  Psychiatric: He has a normal mood and affect. His behavior is normal.  Nursing note and vitals reviewed.    ED Treatments / Results    Labs (all labs ordered are listed, but only abnormal results are displayed) Labs Reviewed  COMPREHENSIVE METABOLIC PANEL - Abnormal; Notable for the following:       Result Value   Potassium 2.7 (*)    All other components within normal limits  CBC WITH DIFFERENTIAL/PLATELET - Abnormal; Notable for the following:    RBC 3.98 (*)    Hemoglobin 12.1 (*)    HCT 35.0 (*)    All other components within normal limits  CULTURE, BLOOD (ROUTINE X 2)  CULTURE, BLOOD (ROUTINE X 2)  URINE CULTURE  URINALYSIS, ROUTINE W REFLEX MICROSCOPIC (NOT AT [facility])  I-STAT CG4 LACTIC ACID, ED  Randolm Idol, ED    EKG  EKG Interpretation None       Radiology Dg Chest 2 View  Result Date: 02/11/2016 CLINICAL DATA:  Weakness and hypotension EXAM: CHEST  2 VIEW COMPARISON:  February 04, 2016 chest x-ray and CT scan FINDINGS:  The patient's known left-sided malignancy is largely obscured by the heart and was better seen on recent CT imaging. Left suprahilar opacity correlates with nodularity on the recent CT scan. The heart, hila, mediastinum, lungs, and pleura are otherwise unchanged. IMPRESSION: No interval change. The patient's known metastatic lung cancer, on the left, was better appreciated on the recent CT scan. Electronically Signed   By: Dorise Bullion III M.D   On: 02/11/2016 13:01    Procedures Procedures (including critical care time)  Medications Ordered in ED Medications  potassium chloride SA (K-DUR,KLOR-CON) CR tablet 40 mEq (not administered)     Initial Impression / Assessment and Plan / ED Course  I have reviewed the triage vital signs and the nursing notes.  Pertinent labs & imaging results that were available during my care of the patient were reviewed by me and considered in my medical decision making (see chart for details).  Clinical Course  Value Comment By Time  Potassium: (!!) 2.7 Low; oral potassium ordered Daleen Bo, MD 07/31 1412  Hemoglobin: (!) 12.1 (Reviewed)  Daleen Bo, MD 07/31 1413  Hemoglobin: (!) 12.1 Stable/improved from last Daleen Bo, MD 07/31 1414    Medications  potassium chloride SA (K-DUR,KLOR-CON) CR tablet 40 mEq (40 mEq Oral Given 02/11/16 1458)    Patient Vitals for the past 24 hrs:  BP Temp Temp src Pulse Resp SpO2 Height Weight  02/11/16 1530 114/71 - - - 20 - - -  02/11/16 1430 97/86 - - - 17 (!) 89 % - -  02/11/16 1400 (!) 106/49 - - - 18 97 % - -  02/11/16 1330 105/61 - - - 22 94 % - -  02/11/16 1300 91/59 - - (!) 58 24 94 % - -  02/11/16 1230 115/67 - - 64 12 96 % - -  02/11/16 1222 - 97.5 F (36.4 C) Oral - - - '5\' 10"'$  (1.778 m) 172 lb (78 kg)  02/11/16 1214 (!) 85/51 - - 85 17 98 % - -    5:40 PM Reevaluation with update and discussion. After initial assessment and treatment, an updated evaluation reveals Patient remains alert., No change in clinical status. Blood pressure right arm automated 119/73 , normal, now. Oxygen saturation on room air 98%, normal, now. Patient's wife states that he had hyperkalemia when he saw his PCP, 6 weeks ago, but he was only treated for 5 days. She affirmed that he is not taking a diuretic medication at this time. She does state that his appetite and oral fluid intake has been low. Findings discussed with patient, and his family members, all questions were answered. Marymargaret Kirker L    Final Clinical Impressions(s) / ED Diagnoses   Final diagnoses:  None    Hypertension, improved with usual treatment, Midodrine. Incidental hypokalemia, without complicating feature. This is likely an ongoing issue. Of note, he was hypokalemic during hospitalization, and appears he was treated. The ecchymosis on his abdomen is likely related to use of subcutaneous heparin, given for DVT prophylaxis, during the recent hospitalization.   Nursing Notes Reviewed/ Care Coordinated Applicable Imaging Reviewed Interpretation of Laboratory Data incorporated into ED treatment  The patient appears  reasonably screened and/or stabilized for discharge and I doubt any other medical condition or other [facility] requiring further screening, evaluation, or treatment in the ED at this time prior to discharge.  Plan: Home Medications- usual plus potassium twice a day for 1 week, then once a day until rechecked; Home Treatments- rest, fluids, push  oral intake; return here if the recommended treatment, does not improve the symptoms; Recommended follow up- PCP checkup 4-7 days.    New Prescriptions New Prescriptions   No medications on file     Daleen Bo, MD 02/11/16 1756

## 2016-02-11 NOTE — ED Notes (Addendum)
Critical K of 2.7 [place] notified.

## 2016-02-11 NOTE — Discharge Instructions (Signed)
Take the potassium twice a day for 1 week as directed. After that you can decrease it to once a day, until you see his doctor.  Make sure that he is drinking lots of fluids.  Try to ensure that he eats 3 full meals each day.

## 2016-02-12 LAB — URINE CULTURE: Culture: NO GROWTH

## 2016-05-14 DEATH — deceased

## 2016-11-24 IMAGING — CT CT ANGIO CHEST
2 of 6 series · 17 of 36 positions shown · IV contrast (isovue)
Comparison: Chest radiograph - 02/04/2016

CLINICAL DATA: Status for left-sided lung cancer, now with chest
pain. Evaluate for pulmonary embolism.

EXAM:
CT ANGIOGRAPHY CHEST WITH CONTRAST
TECHNIQUE: Multidetector CT imaging of the chest was performed using the
standard protocol during bolus administration of intravenous
contrast. Multiplanar CT image reconstructions and MIPs were
obtained to evaluate the vascular anatomy.
CONTRAST:  100 cc Isovue 370

[Series 7: pe thins @ 1mm · axial · 0.74mm/px · z∈[-276,-24]mm · 16 of 282 slices shown]
[im 15/282  lung]
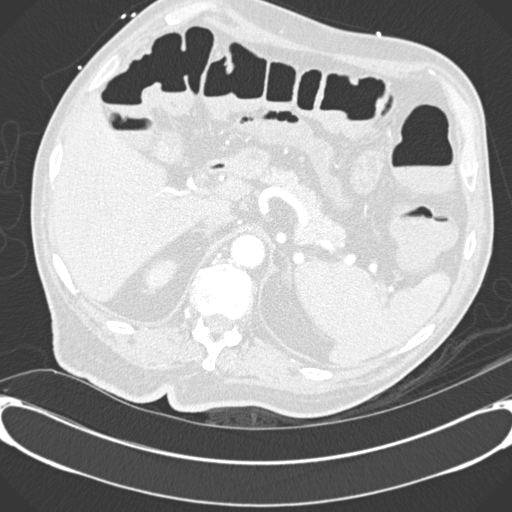
[im 29/282  mediastinal]
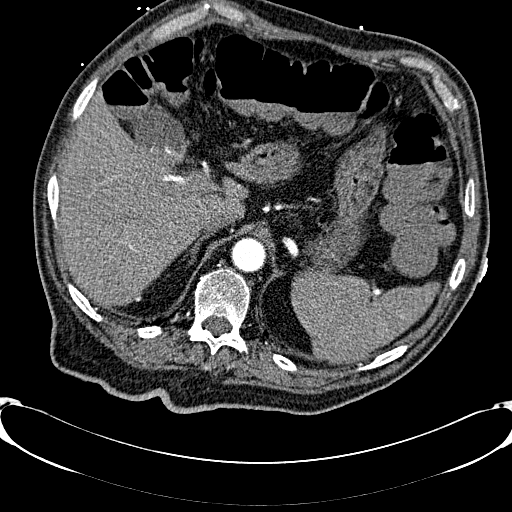
[im 43/282  lung]
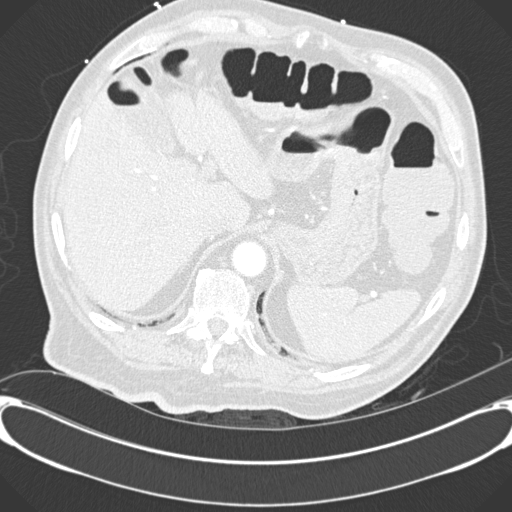
[im 71/282  mediastinal]
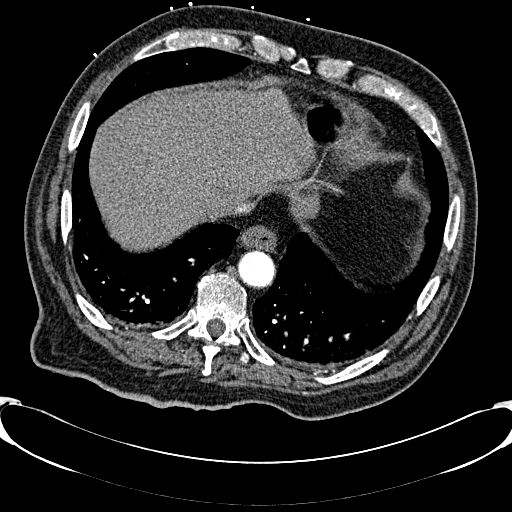
[im 85/282  lung]
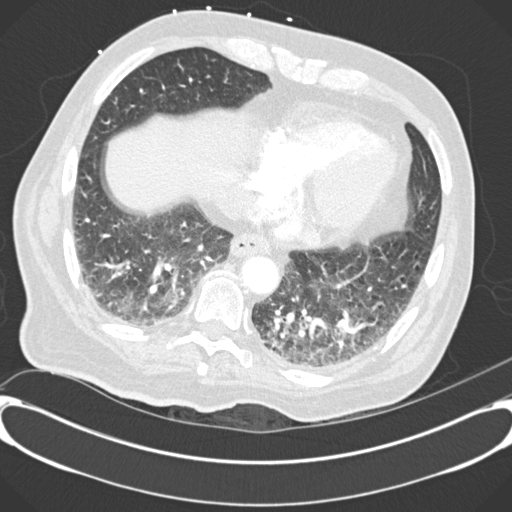
[im 99/282  mediastinal]
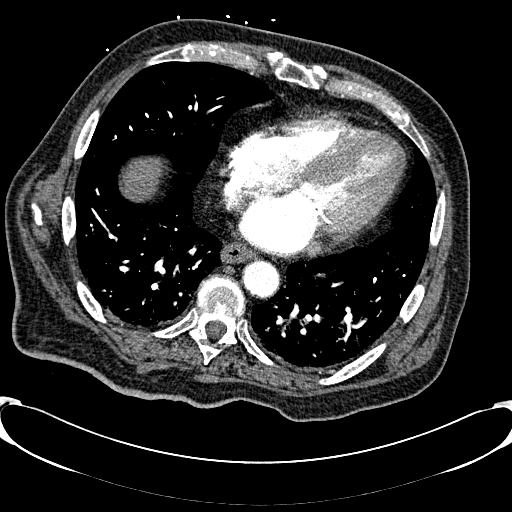
[im 113/282  lung]
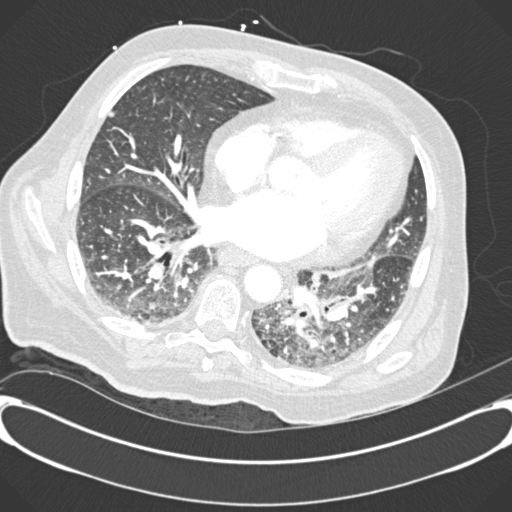
[im 127/282  mediastinal]
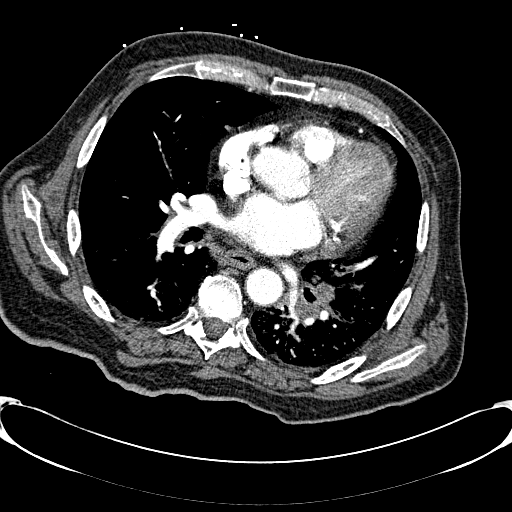
[im 155/282  lung]
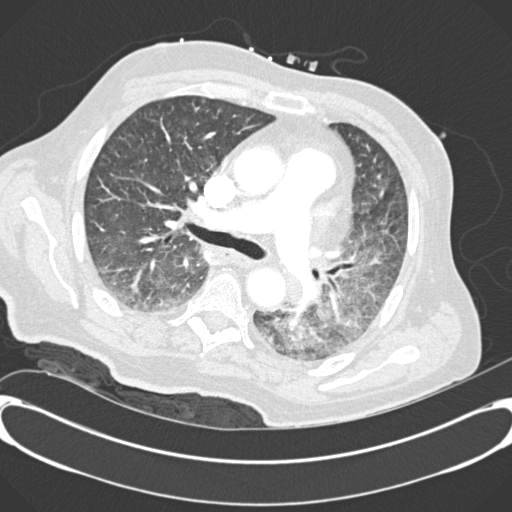
[im 169/282  mediastinal]
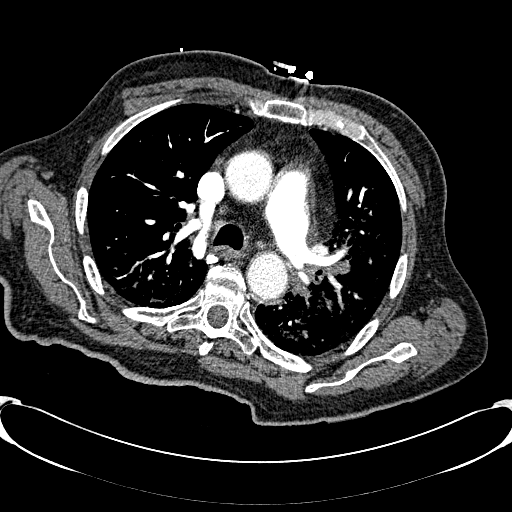
[im 183/282  lung]
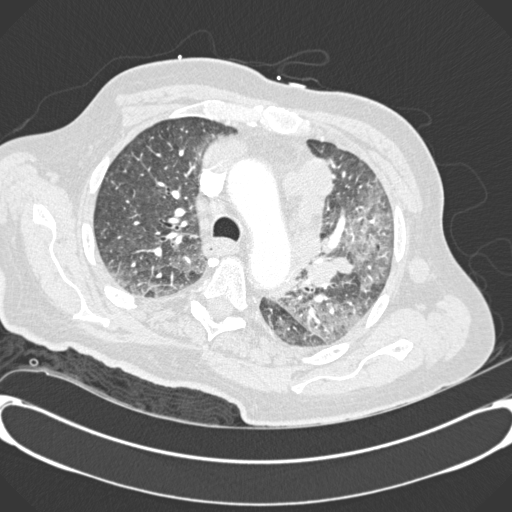
[im 197/282  mediastinal]
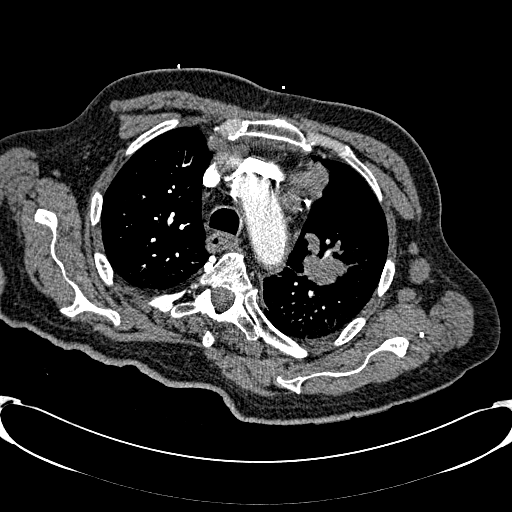
[im 211/282  lung]
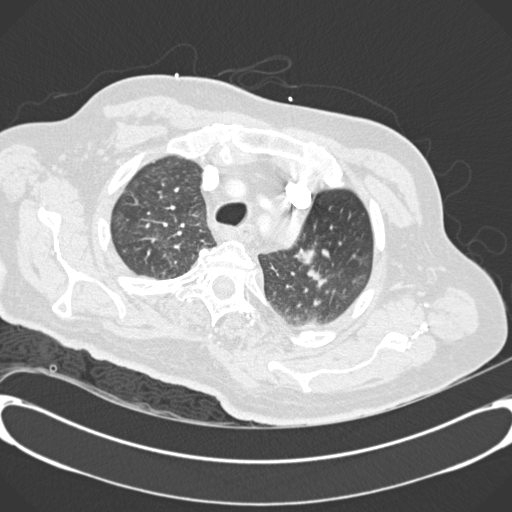
[im 239/282  mediastinal]
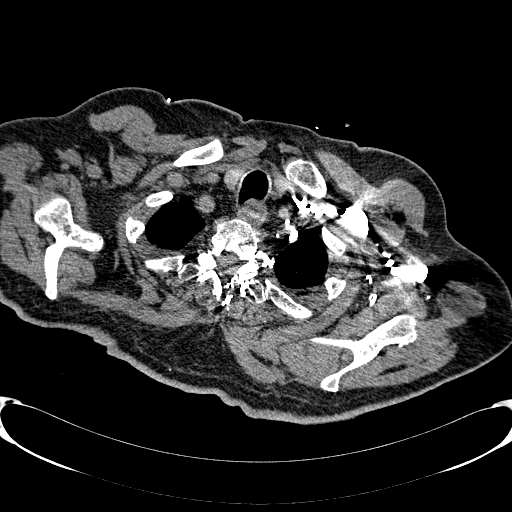
[im 253/282  lung]
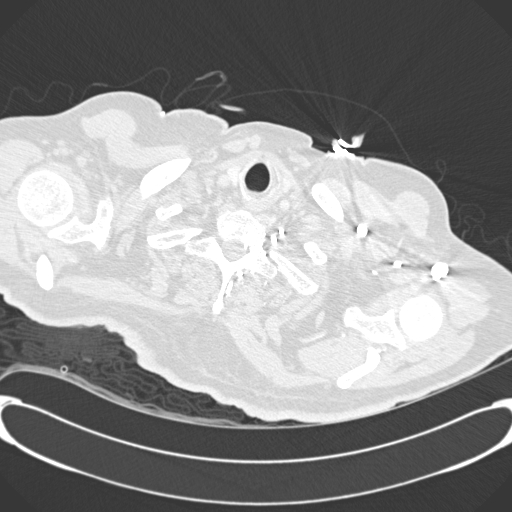
[im 267/282  mediastinal]
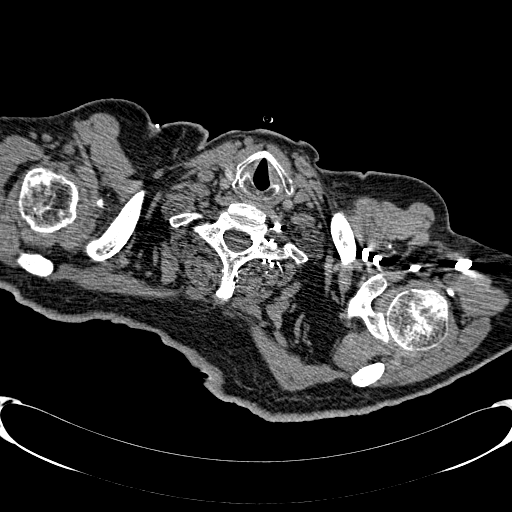

[Series 602: <mpr thick range> · coronal · 0.74mm/px · 1 of 144 slices shown]
[im 72/144  mediastinal]
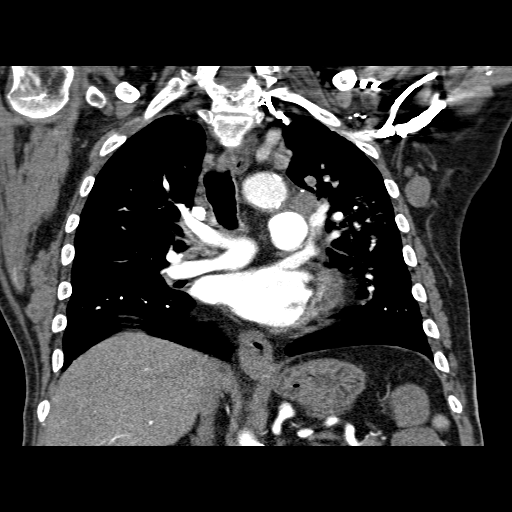

[17 of 36 positions shown; findings below may reference images not displayed]

FINDINGS: Vascular Findings:

There is adequate opacification of the pulmonary arterial system
with the main pulmonary artery measuring 287 Hounsfield units. There
are no discrete filling defects within the pulmonary arterial tree
to suggest pulmonary embolism. Normal caliber the main pulmonary
artery.

Normal heart size. Coronary artery calcifications. Normal caliber
the of the thoracic aorta. No definite thoracic aortic dissection on
this nongated examination. Scattered atherosclerotic plaque within
the aortic arch. Conventional configuration of the aortic arch. The
branch vessels of the aortic arch appear patent throughout their
imaged course.

Review of the MIP images confirms the above findings.

----------------------------------------------------------------------------------

Nonvascular Findings:

Mediastinum/Lymph Nodes: Extensive mediastinal and hilar
lymphadenopathy with index prevascular lymph node measuring 2.8 cm
in greatest short axis diameter (image 32, series 5) and index
infrahilar lymph node measuring 2.2 cm (image 49, series 5). There
is an additional approximately 2.3 cm nodal conglomeration within
the anterior mediastinum which results in mass effect upon the
central aspect of the left innominate vein (representative image 31,
series 5). Post left axillary lymphadenopathy with index node
measuring 1.8 cm (image 32, series 5).

Lungs/Pleura: Evaluation the pulmonary parenchyma is degraded
secondary to patient respiratory artifact.

There is a macro lobulated approximately 3.0 x 2.3 cm mass within
the left upper lobe (image 30, series 5) compatible with provided
history of bronchogenic carcinoma. Note is made of 2 adjacent
satellite nodules within the left upper lobe measuring approximately
1.1 and 1.6 cm in diameter (both nodules seen on image 30, series
8).

This dominant left upper lobe mass results in occlusion of 1 of the
subsegmental bronchi of the left upper lobe (image 54, series 8).
The remaining pulmonary airways appear patent.

Dependent subpleural ground-glass atelectasis. There is ill-defined
ground-glass about primarily the caudal aspect of this dominant
pulmonary nodule/mass, potentially atelectasis though lymphangitic
spread of tumor could result in a similar appearance. No pleural
effusion or pneumothorax.

Upper abdomen: Limited early arterial phase evaluation of the upper
abdomen demonstrates cholelithiasis without definite evidence of
cholecystitis.

Musculoskeletal: No definite acute or aggressive osseous
abnormalities.

Regional soft tissues appear normal. Normal appearance of the
thyroid gland.
IMPRESSION: 1. No evidence of pulmonary embolism.
2. Findings compatible with provided history of stage for
bronchogenic carcinoma with dominant macro lobulated left upper lobe
mass measuring at least 3 cm in diameter with associated adjacent
satellite nodules within left upper lobe and extensive mediastinal,
hilar and left axillary lymphadenopathy. Note, dominant nodal
conglomeration within the anterior mediastinum results in mass
effect upon the central aspect of the left innominate vein without
evidence of occlusion.
3. Ground-glass surrounding the caudal aspect of the dominant left
upper lobe pulmonary mass as well as the left hilum may represent
atelectasis though lymphangitic spread of tumor could result in a
similar appearance.
4. Cholelithiasis without evidence cholecystitis.
5. Coronary artery calcifications. Aortic Atherosclerosis
([id number]-170.0)

## 2016-12-01 IMAGING — CR DG CHEST 2V
2 series · 2 of 2 positions shown · non-contrast
Comparison: February 04, 2016 chest x-ray and CT scan

CLINICAL DATA: Weakness and hypotension

EXAM:
CHEST  2 VIEW

[w chest lat]
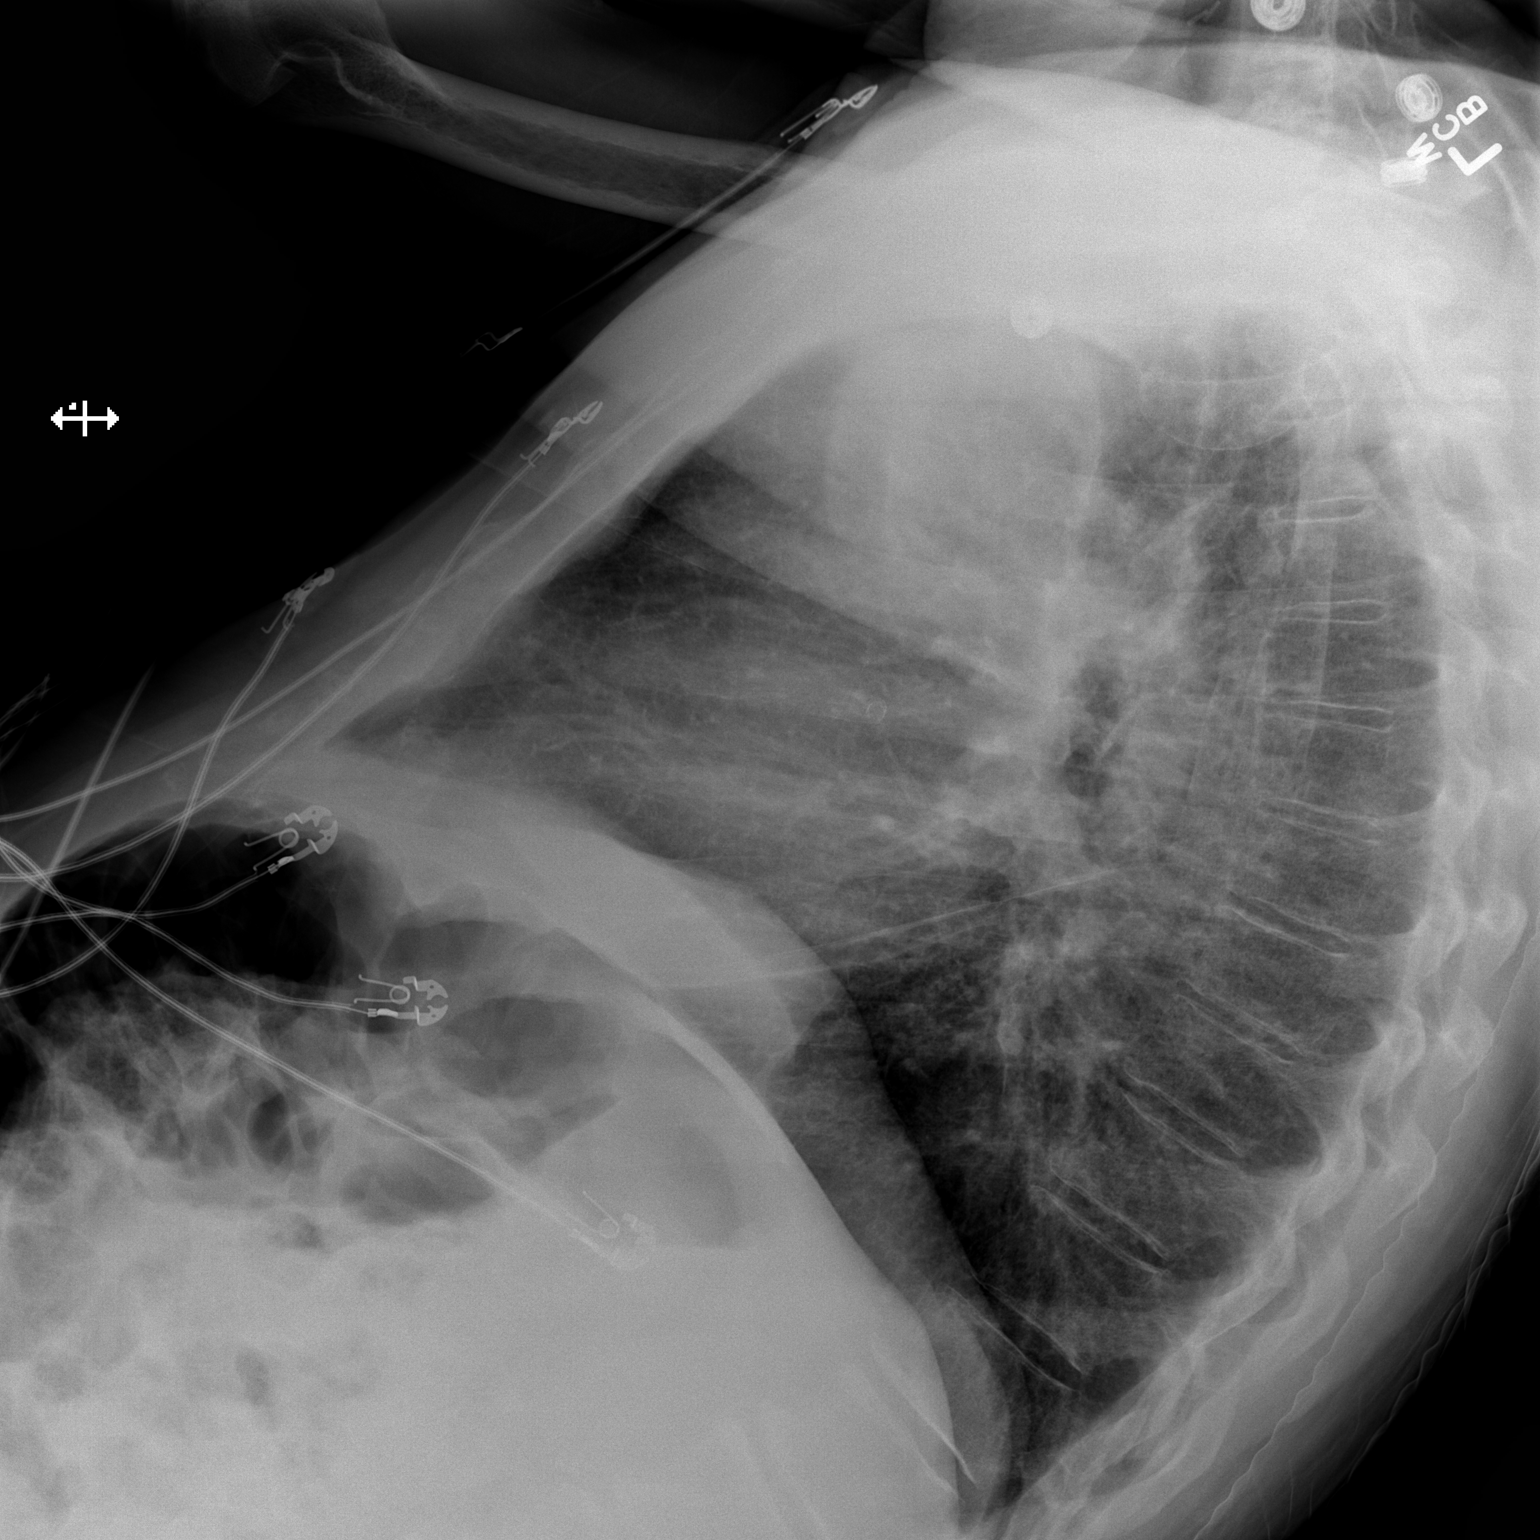

[x chest ap]
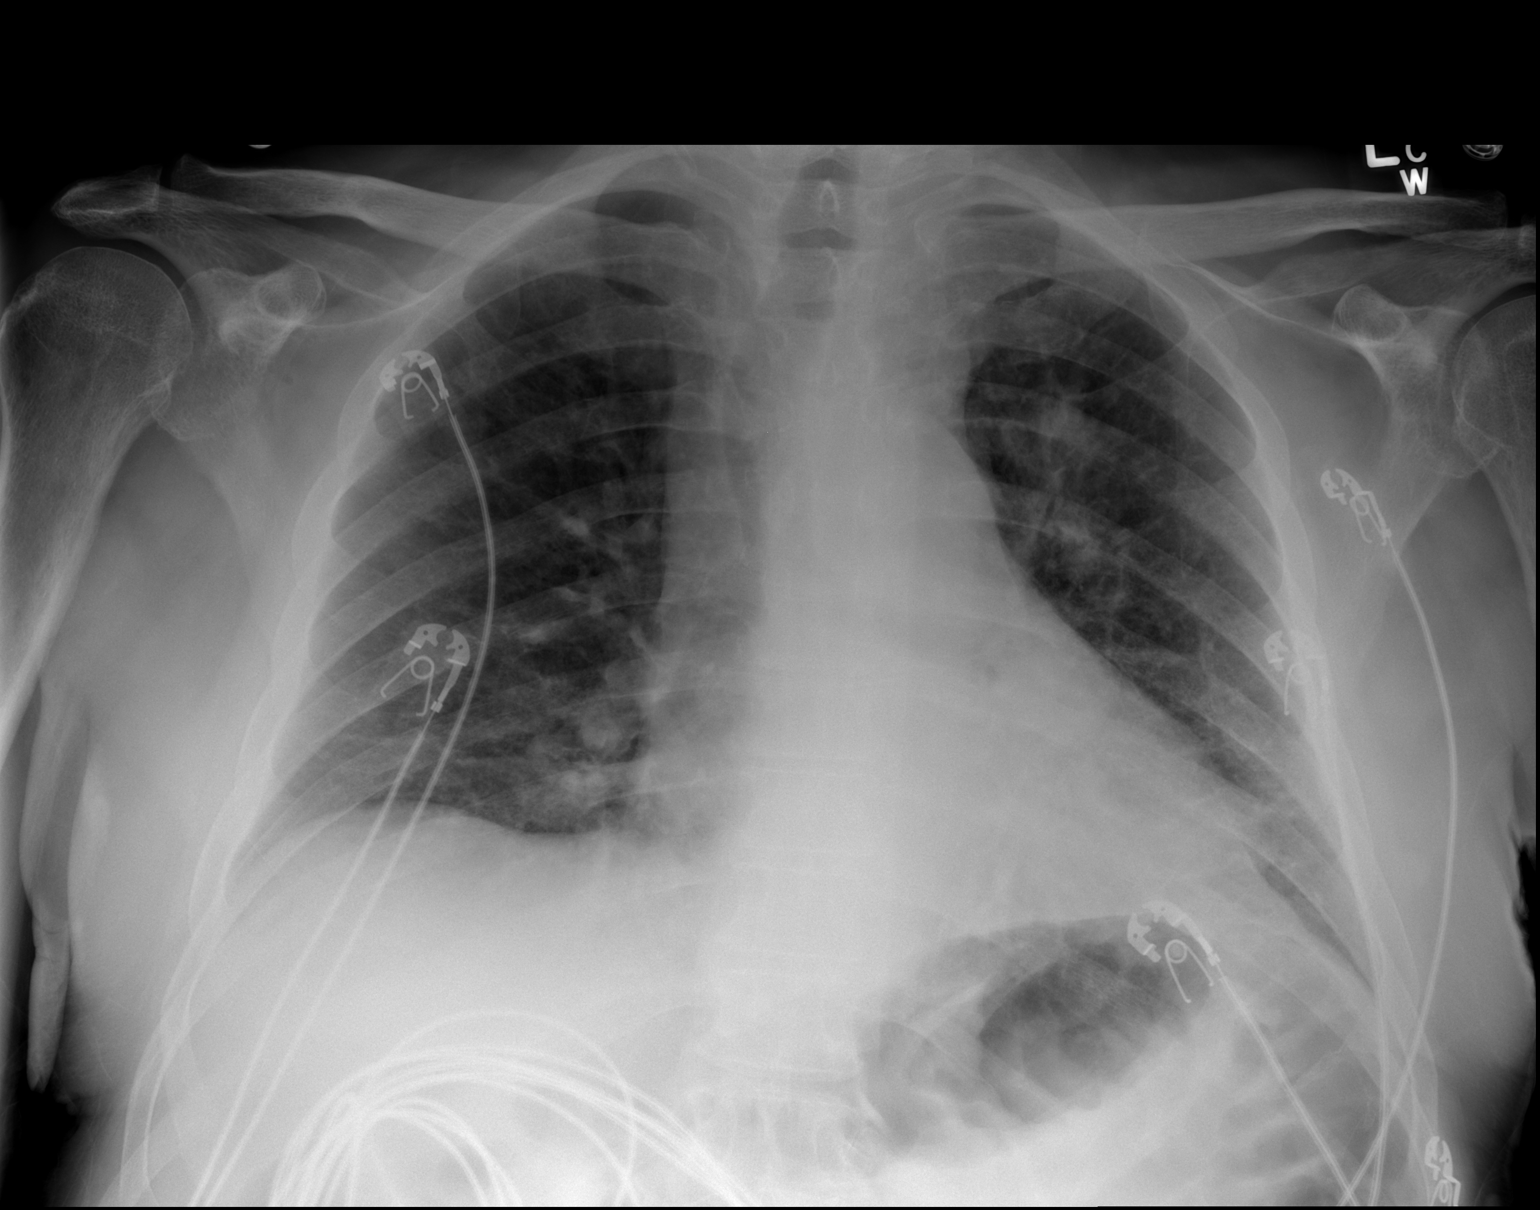

[2 of 2 positions shown; findings below may reference images not displayed]

FINDINGS: The patient's known left-sided malignancy is largely obscured by the
heart and was better seen on recent CT imaging. Left suprahilar
opacity correlates with nodularity on the recent CT scan. The heart,
hila, mediastinum, lungs, and pleura are otherwise unchanged.
IMPRESSION: No interval change. The patient's known metastatic lung cancer, on
the left, was better appreciated on the recent CT scan.
